# Patient Record
Sex: Male | Born: 1980 | Race: Black or African American | Hispanic: No | Marital: Single | Smoking: Current some day smoker
Health system: Southern US, Community
[De-identification: ages and names within clinical notes are randomized; demographics above are authoritative.]

## PROBLEM LIST (undated history)

## (undated) DIAGNOSIS — T7840XA Allergy, unspecified, initial encounter: Secondary | ICD-10-CM

## (undated) DIAGNOSIS — K529 Noninfective gastroenteritis and colitis, unspecified: Secondary | ICD-10-CM

## (undated) DIAGNOSIS — J45909 Unspecified asthma, uncomplicated: Secondary | ICD-10-CM

## (undated) HISTORY — DX: Allergy, unspecified, initial encounter: T78.40XA

## (undated) HISTORY — DX: Unspecified asthma, uncomplicated: J45.909

## (undated) HISTORY — PX: ANKLE SURGERY: SHX546

---

## 2014-07-16 ENCOUNTER — Ambulatory Visit (INDEPENDENT_AMBULATORY_CARE_PROVIDER_SITE_OTHER): Payer: Self-pay | Admitting: Family Medicine

## 2014-07-16 VITALS — BP 124/76 | HR 77 | Temp 98.5°F | Resp 17 | Ht 66.0 in | Wt 174.0 lb

## 2014-07-16 DIAGNOSIS — J45901 Unspecified asthma with (acute) exacerbation: Secondary | ICD-10-CM

## 2014-07-16 DIAGNOSIS — J4521 Mild intermittent asthma with (acute) exacerbation: Secondary | ICD-10-CM

## 2014-07-16 MED ORDER — ALBUTEROL SULFATE HFA 108 (90 BASE) MCG/ACT IN AERS: 2.0000 | INHALATION_SPRAY | Freq: Four times a day (QID) | RESPIRATORY_TRACT | Status: DC | PRN

## 2014-07-16 NOTE — Progress Notes (Signed)
Is a 33 year old man recently released from prison who has asthma. He really has no money and is just getting back on his feet. 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  Objective: Patient has expiratory wheezes but is in no acute respiratory difficulties. Heart: Regular no murmur  Assessment: Mild persistent asthma  Plan: Continue theQVAR and add the rescue inhaler albuterol Aref signed, Sheila OatsKurt Zaydon Kinser M.D.

## 2015-07-21 ENCOUNTER — Other Ambulatory Visit: Payer: Self-pay | Admitting: Family Medicine

## 2015-07-22 ENCOUNTER — Telehealth: Payer: Self-pay

## 2015-07-22 NOTE — Telephone Encounter (Signed)
Spoke with pt, advised Brian Esparza sent in one refill but he needs to come in. Pt understood.

## 2015-07-22 NOTE — Telephone Encounter (Signed)
Pt would like a refill on his albuterol (PROVENTIL HFA;VENTOLIN HFA) 108 (90 BASE) MCG/ACT inhaler [[id number]]. I advised him would need to be seen again before he could  get a refill. I told him I thought so. Please advise at [phone]

## 2015-07-24 ENCOUNTER — Other Ambulatory Visit: Payer: Self-pay | Admitting: Family Medicine

## 2015-09-27 ENCOUNTER — Emergency Department (HOSPITAL_COMMUNITY)
Admission: EM | Admit: 2015-09-27 | Discharge: 2015-09-27 | Disposition: A | Discharge status: Home / Self Care | Payer: No Typology Code available for payment source | Attending: Emergency Medicine | Admitting: Emergency Medicine

## 2015-09-27 ENCOUNTER — Encounter (HOSPITAL_COMMUNITY): Payer: Self-pay | Admitting: Emergency Medicine

## 2015-09-27 DIAGNOSIS — S199XXA Unspecified injury of neck, initial encounter: Secondary | ICD-10-CM | POA: Insufficient documentation

## 2015-09-27 DIAGNOSIS — Y92410 Unspecified street and highway as the place of occurrence of the external cause: Secondary | ICD-10-CM | POA: Diagnosis not present

## 2015-09-27 DIAGNOSIS — M62838 Other muscle spasm: Secondary | ICD-10-CM

## 2015-09-27 DIAGNOSIS — J45909 Unspecified asthma, uncomplicated: Secondary | ICD-10-CM | POA: Insufficient documentation

## 2015-09-27 DIAGNOSIS — Z720 Tobacco use: Secondary | ICD-10-CM | POA: Insufficient documentation

## 2015-09-27 DIAGNOSIS — S3992XA Unspecified injury of lower back, initial encounter: Secondary | ICD-10-CM | POA: Insufficient documentation

## 2015-09-27 DIAGNOSIS — M62830 Muscle spasm of back: Secondary | ICD-10-CM | POA: Diagnosis not present

## 2015-09-27 DIAGNOSIS — Y9389 Activity, other specified: Secondary | ICD-10-CM | POA: Insufficient documentation

## 2015-09-27 DIAGNOSIS — Y998 Other external cause status: Secondary | ICD-10-CM | POA: Diagnosis not present

## 2015-09-27 MED ORDER — CYCLOBENZAPRINE HCL 10 MG PO TABS: 10.0000 mg | ORAL_TABLET | Freq: Two times a day (BID) | ORAL | Status: DC | PRN

## 2015-09-27 MED ORDER — OXYCODONE-ACETAMINOPHEN 5-325 MG PO TABS: 1.0000 | ORAL_TABLET | Freq: Once | ORAL | Status: DC

## 2015-09-27 MED ORDER — ACETAMINOPHEN 500 MG PO TABS: 500.0000 mg | ORAL_TABLET | Freq: Four times a day (QID) | ORAL | Status: DC | PRN

## 2015-09-27 NOTE — Discharge Instructions (Signed)
Back Injury Prevention °Back injuries can be very painful. They can also be difficult to heal. After having one back injury, you are more likely to injure your back again. It is important to learn how to avoid injuring or re-injuring your back. The following tips can help you to prevent a back injury. °WHAT SHOULD I KNOW ABOUT PHYSICAL FITNESS? °· Exercise for 30 minutes per day on most days of the week or as directed by your health care provider. Make sure to: °· Do aerobic exercises, such as walking, jogging, biking, or swimming. °· Do exercises that increase balance and strength, such as tai chi and yoga. These can decrease your risk of falling and injuring your back. °· Do stretching exercises to help with flexibility. °· Try to develop strong abdominal muscles. Your abdominal muscles provide a lot of the support that is needed by your back. °· Maintain a healthy weight.  This helps to decrease your risk of a back injury. °WHAT SHOULD I KNOW ABOUT MY DIET? °· Talk with your health care provider about your overall diet. Take supplements and vitamins only as directed by your health care provider. °· Talk with your health care provider about how much calcium and vitamin D you need each day. These nutrients help to prevent weakening of the bones (osteoporosis). Osteoporosis can cause broken (fractured) bones, which lead to back pain. °· Include good sources of calcium in your diet, such as dairy products, green leafy vegetables, and products that have had calcium added to them (fortified). °· Include good sources of vitamin D in your diet, such as milk and foods that are fortified with vitamin D. °WHAT SHOULD I KNOW ABOUT MY POSTURE? °· Sit up straight and stand up straight. Avoid leaning forward when you sit or hunching over when you stand. °· Choose chairs that have good low-back (lumbar) support. °· If you work at a desk, sit close to it so you do not need to lean over. Keep your chin tucked in. Keep your neck  drawn back, and keep your elbows bent at a right angle. Your arms should look like the letter "L." °· Sit high and close to the steering wheel when you drive. Add a lumbar support to your car seat, if needed. °· Avoid sitting or standing in one position for very long. Take breaks to get up, stretch, and walk around at least one time every hour. Take breaks every hour if you are driving for long periods of time. °· Sleep on your side with your knees slightly bent, or sleep on your back with a pillow under your knees. Do not lie on the front of your body to sleep. °WHAT SHOULD I KNOW ABOUT LIFTING, TWISTING, AND REACHING? °Lifting and Heavy Lifting °· Avoid heavy lifting, especially repetitive heavy lifting. If you must do heavy lifting: °· Stretch before lifting. °· Work slowly. °· Rest between lifts. °· Use a tool such as a cart or a dolly to move objects if one is available. °· Make several small trips instead of carrying one heavy load. °· Ask for help when you need it, especially when moving big objects. °· Follow these steps when lifting: °· Stand with your feet shoulder-width apart. °· Get as close to the object as you can. Do not try to pick up a heavy object that is far from your body. °· Use handles or lifting straps if they are available. °· Bend at your knees. Squat down, but keep your heels off the floor. °·   Keep your shoulders pulled back, your chin tucked in, and your back straight.  Lift the object slowly while you tighten the muscles in your legs, abdomen, and buttocks. Keep the object as close to the center of your body as possible.  Follow these steps when putting down a heavy load:  Stand with your feet shoulder-width apart.  Lower the object slowly while you tighten the muscles in your legs, abdomen, and buttocks. Keep the object as close to the center of your body as possible.  Keep your shoulders pulled back, your chin tucked in, and your back straight.  Bend at your knees. Squat  down, but keep your heels off the floor.  Use handles or lifting straps if they are available. Twisting and Reaching  Avoid lifting heavy objects above your waist.  Do not twist at your waist while you are lifting or carrying a load. If you need to turn, move your feet.  Do not bend over without bending at your knees.  Avoid reaching over your head, across a table, or for an object on a high surface. WHAT ARE SOME OTHER TIPS? 1. Avoid wet floors and icy ground. Keep sidewalks clear of ice to prevent falls. 2. Do not sleep on a mattress that is too soft or too hard. 3. Keep items that are used frequently within easy reach. 4. Put heavier objects on shelves at waist level, and put lighter objects on lower or higher shelves. 5. Find ways to decrease your stress, such as exercise, massage, or relaxation techniques. Stress can build up in your muscles. Tense muscles are more vulnerable to injury. 6. Talk with your health care provider if you feel anxious or depressed. These conditions can make back pain worse. 7. Wear flat heel shoes with cushioned soles. 8. Avoid sudden movements. 9. Use both shoulder straps when carrying a backpack. 10. Do not use any tobacco products, including cigarettes, chewing tobacco, or electronic cigarettes. If you need help quitting, ask your health care provider.   This information is not intended to replace advice given to you by your health care provider. Make sure you discuss any questions you have with your health care provider.   Document Released: 01/13/2005 Document Revised: 04/22/2015 Document Reviewed: 12/10/2014 Elsevier Interactive Patient Education 2016 [employer]  Back Exercises The following exercises strengthen the muscles that help to support the back. They also help to keep the lower back flexible. Doing these exercises can help to prevent back pain or lessen existing pain. If you have back pain or discomfort, try doing these exercises 2-3  times each day or as told by your health care provider. When the pain goes away, do them once each day, but increase the number of times that you repeat the steps for each exercise (do more repetitions). If you do not have back pain or discomfort, do these exercises once each day or as told by your health care provider. EXERCISES Single Knee to Chest Repeat these steps 3-5 times for each leg:  Lie on your back on a firm bed or the floor with your legs extended.  Bring one knee to your chest. Your other leg should stay extended and in contact with the floor.  Hold your knee in place by grabbing your knee or thigh.  Pull on your knee until you feel a gentle stretch in your lower back.  Hold the stretch for 10-30 seconds.  Slowly release and straighten your leg. Pelvic Tilt Repeat these steps 5-10 times:  Lie on your back on a firm bed or the floor with your legs extended.  Bend your knees so they are pointing toward the ceiling and your feet are flat on the floor.  Tighten your lower abdominal muscles to press your lower back against the floor. This motion will tilt your pelvis so your tailbone points up toward the ceiling instead of pointing to your feet or the floor.  With gentle tension and even breathing, hold this position for 5-10 seconds. Cat-Cow Repeat these steps until your lower back becomes more flexible:  Get into a hands-and-knees position on a firm surface. Keep your hands under your shoulders, and keep your knees under your hips. You may place padding under your knees for comfort.  Let your head hang down, and point your tailbone toward the floor so your lower back becomes rounded like the back of a cat.  Hold this position for 5 seconds.  Slowly lift your head and point your tailbone up toward the ceiling so your back forms a sagging arch like the back of a cow.  Hold this position for 5 seconds. Press-Ups Repeat these steps 5-10 times:  Lie on your abdomen  (face-down) on the floor.  Place your palms near your head, about shoulder-width apart.  While you keep your back as relaxed as possible and keep your hips on the floor, slowly straighten your arms to raise the top half of your body and lift your shoulders. Do not use your back muscles to raise your upper torso. You may adjust the placement of your hands to make yourself more comfortable.  Hold this position for 5 seconds while you keep your back relaxed.  Slowly return to lying flat on the floor. Bridges Repeat these steps 10 times:  Lie on your back on a firm surface.  Bend your knees so they are pointing toward the ceiling and your feet are flat on the floor.  Tighten your buttocks muscles and lift your buttocks off of the floor until your waist is at almost the same height as your knees. You should feel the muscles working in your buttocks and the back of your thighs. If you do not feel these muscles, slide your feet 1-2 inches farther away from your buttocks.  Hold this position for 3-5 seconds.  Slowly lower your hips to the starting position, and allow your buttocks muscles to relax completely. If this exercise is too easy, try doing it with your arms crossed over your chest. Abdominal Crunches Repeat these steps 5-10 times: 11. Lie on your back on a firm bed or the floor with your legs extended. 12. Bend your knees so they are pointing toward the ceiling and your feet are flat on the floor. 44. Cross your arms over your chest. 14. Tip your chin slightly toward your chest without bending your neck. 68. Tighten your abdominal muscles and slowly raise your trunk (torso) high enough to lift your shoulder blades a tiny bit off of the floor. Avoid raising your torso higher than that, because it can put too much stress on your low back and it does not help to strengthen your abdominal muscles. 16. Slowly return to your starting position. Back Lifts Repeat these steps 5-10  times: 1. Lie on your abdomen (face-down) with your arms at your sides, and rest your forehead on the floor. 2. Tighten the muscles in your legs and your buttocks. 3. Slowly lift your chest off of the floor while you keep your hips  pressed to the floor. Keep the back of your head in line with the curve in your back. Your eyes should be looking at the floor. 4. Hold this position for 3-5 seconds. 5. Slowly return to your starting position. SEEK MEDICAL CARE IF:  Your back pain or discomfort gets much worse when you do an exercise.  Your back pain or discomfort does not lessen within 2 hours after you exercise. If you have any of these problems, stop doing these exercises right away. Do not do them again unless your health care provider says that you can. SEEK IMMEDIATE MEDICAL CARE IF:  You develop sudden, severe back pain. If this happens, stop doing the exercises right away. Do not do them again unless your health care provider says that you can.   This information is not intended to replace advice given to you by your health care provider. Make sure you discuss any questions you have with your health care provider.   Document Released: 01/13/2005 Document Revised: 08/27/2015 Document Reviewed: 01/30/2015 Elsevier Interactive Patient Education 2016 [place] therapy can help ease sore, stiff, injured, and tight muscles and joints. Heat relaxes your muscles, which may help ease your pain.  RISKS AND COMPLICATIONS If you have any of the following conditions, do not use heat therapy unless your health care provider has approved:  Poor circulation.  Healing wounds or scarred skin in the area being treated.  Diabetes, heart disease, or high blood pressure.  Not being able to feel (numbness) the area being treated.  Unusual swelling of the area being treated.  Active infections.  Blood clots.  Cancer.  Inability to communicate pain. This may include young  children and people who have problems with their brain function (dementia).  Pregnancy. Heat therapy should only be used on old, pre-existing, or long-lasting (chronic) injuries. Do not use heat therapy on new injuries unless directed by your health care provider. HOW TO USE HEAT THERAPY There are several different kinds of heat therapy, including:  Moist heat pack.  Warm water bath.  Hot water bottle.  Electric heating pad.  Heated gel pack.  Heated wrap.  Electric heating pad. Use the heat therapy method suggested by your health care provider. Follow your health care provider's instructions on when and how to use heat therapy. GENERAL HEAT THERAPY RECOMMENDATIONS  Do not sleep while using heat therapy. Only use heat therapy while you are awake.  Your skin may turn pink while using heat therapy. Do not use heat therapy if your skin turns red.  Do not use heat therapy if you have new pain.  High heat or long exposure to heat can cause burns. Be careful when using heat therapy to avoid burning your skin.  Do not use heat therapy on areas of your skin that are already irritated, such as with a rash or sunburn. SEEK MEDICAL CARE IF:  You have blisters, redness, swelling, or numbness.  You have new pain.  Your pain is worse. MAKE SURE YOU:  Understand these instructions.  Will watch your condition.  Will get help right away if you are not doing well or get worse.   This information is not intended to replace advice given to you by your health care provider. Make sure you discuss any questions you have with your health care provider.   Return to the emergency department a few experience bowel or bladder incontinence, numbness or tingling the extremities, worsening of her symptoms, difficulty breathing,  chest pain.
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
## 2015-09-29 ENCOUNTER — Emergency Department (HOSPITAL_COMMUNITY): Payer: No Typology Code available for payment source

## 2015-09-29 ENCOUNTER — Emergency Department (HOSPITAL_COMMUNITY)
Admission: EM | Admit: 2015-09-29 | Discharge: 2015-09-29 | Disposition: A | Discharge status: Home / Self Care | Payer: No Typology Code available for payment source | Attending: Emergency Medicine | Admitting: Emergency Medicine

## 2015-09-29 ENCOUNTER — Encounter (HOSPITAL_COMMUNITY): Payer: Self-pay | Admitting: *Deleted

## 2015-09-29 DIAGNOSIS — Y998 Other external cause status: Secondary | ICD-10-CM | POA: Insufficient documentation

## 2015-09-29 DIAGNOSIS — Y92410 Unspecified street and highway as the place of occurrence of the external cause: Secondary | ICD-10-CM | POA: Diagnosis not present

## 2015-09-29 DIAGNOSIS — M545 Low back pain, unspecified: Secondary | ICD-10-CM

## 2015-09-29 DIAGNOSIS — Z720 Tobacco use: Secondary | ICD-10-CM | POA: Diagnosis not present

## 2015-09-29 DIAGNOSIS — S3992XA Unspecified injury of lower back, initial encounter: Secondary | ICD-10-CM | POA: Insufficient documentation

## 2015-09-29 DIAGNOSIS — S134XXA Sprain of ligaments of cervical spine, initial encounter: Secondary | ICD-10-CM | POA: Diagnosis not present

## 2015-09-29 DIAGNOSIS — S199XXA Unspecified injury of neck, initial encounter: Secondary | ICD-10-CM | POA: Diagnosis present

## 2015-09-29 DIAGNOSIS — Z79899 Other long term (current) drug therapy: Secondary | ICD-10-CM | POA: Diagnosis not present

## 2015-09-29 DIAGNOSIS — S139XXA Sprain of joints and ligaments of unspecified parts of neck, initial encounter: Secondary | ICD-10-CM

## 2015-09-29 DIAGNOSIS — J45909 Unspecified asthma, uncomplicated: Secondary | ICD-10-CM | POA: Diagnosis not present

## 2015-09-29 DIAGNOSIS — Y9389 Activity, other specified: Secondary | ICD-10-CM | POA: Diagnosis not present

## 2015-09-29 MED ORDER — NAPROXEN 500 MG PO TABS: 500.0000 mg | ORAL_TABLET | Freq: Two times a day (BID) | ORAL | Status: DC

## 2015-09-29 NOTE — ED Notes (Signed)
Declined W/C at D/C and was escorted to lobby by RN. 
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
## 2015-12-14 ENCOUNTER — Encounter (HOSPITAL_COMMUNITY): Payer: Self-pay | Admitting: Vascular Surgery

## 2015-12-14 ENCOUNTER — Emergency Department (HOSPITAL_COMMUNITY)
Admission: EM | Admit: 2015-12-14 | Discharge: 2015-12-14 | Disposition: A | Discharge status: Home / Self Care | Payer: Self-pay | Attending: Emergency Medicine | Admitting: Emergency Medicine

## 2015-12-14 ENCOUNTER — Emergency Department (HOSPITAL_COMMUNITY): Payer: Self-pay

## 2015-12-14 DIAGNOSIS — Z79899 Other long term (current) drug therapy: Secondary | ICD-10-CM | POA: Insufficient documentation

## 2015-12-14 DIAGNOSIS — J45901 Unspecified asthma with (acute) exacerbation: Secondary | ICD-10-CM | POA: Insufficient documentation

## 2015-12-14 DIAGNOSIS — Z791 Long term (current) use of non-steroidal anti-inflammatories (NSAID): Secondary | ICD-10-CM | POA: Insufficient documentation

## 2015-12-14 DIAGNOSIS — F17210 Nicotine dependence, cigarettes, uncomplicated: Secondary | ICD-10-CM | POA: Insufficient documentation

## 2015-12-14 MED ORDER — ALBUTEROL SULFATE (2.5 MG/3ML) 0.083% IN NEBU
INHALATION_SOLUTION | RESPIRATORY_TRACT | Status: AC
  Filled 2015-12-14: qty 6

## 2015-12-14 MED ORDER — PREDNISONE 20 MG PO TABS: 40.0000 mg | ORAL_TABLET | Freq: Every day | ORAL | Status: DC

## 2015-12-14 MED ORDER — ALBUTEROL SULFATE (2.5 MG/3ML) 0.083% IN NEBU
5.0000 mg | INHALATION_SOLUTION | Freq: Once | RESPIRATORY_TRACT | Status: AC
  Administered 2015-12-14: 5 mg via RESPIRATORY_TRACT

## 2015-12-14 MED ORDER — ALBUTEROL SULFATE HFA 108 (90 BASE) MCG/ACT IN AERS
2.0000 | INHALATION_SPRAY | Freq: Once | RESPIRATORY_TRACT | Status: AC
  Administered 2015-12-14: 2 via RESPIRATORY_TRACT
  Filled 2015-12-14: qty 6.7

## 2015-12-14 MED ORDER — PREDNISONE 20 MG PO TABS
60.0000 mg | ORAL_TABLET | Freq: Once | ORAL | Status: AC
  Administered 2015-12-14: 60 mg via ORAL
  Filled 2015-12-14: qty 3

## 2015-12-14 NOTE — ED Notes (Signed)
Patient transported to X-ray 

## 2015-12-14 NOTE — ED Notes (Signed)
Pt reports to the ED for eval of asthma exacerbation. Pt reports that this past Wednesday he ran out of his inhaler and has been having SOB and wheezing ever since. Pt also reports nasal congestion and a productive green cough. Denies any fevers, chills, or N/V/D. Pt A&Ox4, resp e/u, and skin warm and dry.

## 2015-12-14 NOTE — Discharge Instructions (Signed)
Inhaler 2 puffs every 4 hrs. Prednisone as prescribed until all gone. Follow up with primary care doctor. Stop smoking.    Asthma, Adult Asthma is a recurring condition in which the airways tighten and narrow. Asthma can make it difficult to breathe. It can cause coughing, wheezing, and shortness of breath. Asthma episodes, also called asthma attacks, range from minor to life-threatening. Asthma cannot be cured, but medicines and lifestyle changes can help control it. CAUSES Asthma is believed to be caused by inherited (genetic) and environmental factors, but its exact cause is unknown. Asthma may be triggered by allergens, lung infections, or irritants in the air. Asthma triggers are different for each person. Common triggers include:   Animal dander.  Dust mites.  Cockroaches.  Pollen from trees or grass.  Mold.  Smoke.  Air pollutants such as dust, household cleaners, hair sprays, aerosol sprays, paint fumes, strong chemicals, or strong odors.  Cold air, weather changes, and winds (which increase molds and pollens in the air).  Strong emotional expressions such as crying or laughing hard.  Stress.  Certain medicines (such as aspirin) or types of drugs (such as beta-blockers).  Sulfites in foods and drinks. Foods and drinks that may contain sulfites include dried fruit, potato chips, and sparkling grape juice.  Infections or inflammatory conditions such as the flu, a cold, or an inflammation of the nasal membranes (rhinitis).  Gastroesophageal reflux disease (GERD).  Exercise or strenuous activity. SYMPTOMS Symptoms may occur immediately after asthma is triggered or many hours later. Symptoms include:  Wheezing.  Excessive nighttime or early morning coughing.  Frequent or severe coughing with a common cold.  Chest tightness.  Shortness of breath. DIAGNOSIS  The diagnosis of asthma is made by a review of your medical history and a physical exam. Tests may also be  performed. These may include:  Lung function studies. These tests show how much air you breathe in and out.  Allergy tests.  Imaging tests such as X-rays. TREATMENT  Asthma cannot be cured, but it can usually be controlled. Treatment involves identifying and avoiding your asthma triggers. It also involves medicines. There are 2 classes of medicine used for asthma treatment:   Controller medicines. These prevent asthma symptoms from occurring. They are usually taken every day.  Reliever or rescue medicines. These quickly relieve asthma symptoms. They are used as needed and provide short-term relief. Your health care provider will help you create an asthma action plan. An asthma action plan is a written plan for managing and treating your asthma attacks. It includes a list of your asthma triggers and how they may be avoided. It also includes information on when medicines should be taken and when their dosage should be changed. An action plan may also involve the use of a device called a peak flow meter. A peak flow meter measures how well the lungs are working. It helps you monitor your condition. HOME CARE INSTRUCTIONS   Take medicines only as directed by your health care provider. Speak with your health care provider if you have questions about how or when to take the medicines.  Use a peak flow meter as directed by your health care provider. Record and keep track of readings.  Understand and use the action plan to help minimize or stop an asthma attack without needing to seek medical care.  Control your home environment in the following ways to help prevent asthma attacks:  Do not smoke. Avoid being exposed to secondhand smoke.  Change your  heating and air conditioning filter regularly.  Limit your use of fireplaces and wood stoves.  Get rid of pests (such as roaches and mice) and their droppings.  Throw away plants if you see mold on them.  Clean your floors and dust regularly.  Use unscented cleaning products.  Try to have someone else vacuum for you regularly. Stay out of rooms while they are being vacuumed and for a short while afterward. If you vacuum, use a dust mask from a hardware store, a double-layered or microfilter vacuum cleaner bag, or a vacuum cleaner with a HEPA filter.  Replace carpet with wood, tile, or vinyl flooring. Carpet can trap dander and dust.  Use allergy-proof pillows, mattress covers, and box spring covers.  Wash bed sheets and blankets every week in hot water and dry them in a dryer.  Use blankets that are made of polyester or cotton.  Clean bathrooms and kitchens with bleach. If possible, have someone repaint the walls in these rooms with mold-resistant paint. Keep out of the rooms that are being cleaned and painted.  Wash hands frequently. SEEK MEDICAL CARE IF:   You have wheezing, shortness of breath, or a cough even if taking medicine to prevent attacks.  The colored mucus you cough up (sputum) is thicker than usual.  Your sputum changes from clear or white to yellow, green, gray, or bloody.  You have any problems that may be related to the medicines you are taking (such as a rash, itching, swelling, or trouble breathing).  You are using a reliever medicine more than 2-3 times per week.  Your peak flow is still at 50-79% of your personal best after following your action plan for 1 hour.  You have a fever. SEEK IMMEDIATE MEDICAL CARE IF:   You seem to be getting worse and are unresponsive to treatment during an asthma attack.  You are short of breath even at rest.  You get short of breath when doing very little physical activity.  You have difficulty eating, drinking, or talking due to asthma symptoms.  You develop chest pain.  You develop a fast heartbeat.  You have a bluish color to your lips or fingernails.  You are light-headed, dizzy, or faint.  Your peak flow is less than 50% of your personal best.     This information is not intended to replace advice given to you by your health care provider. Make sure you discuss any questions you have with your health care provider.   Document Released: 12/06/2005 Document Revised: 08/27/2015 Document Reviewed: 07/05/2013 Elsevier Interactive Patient Education [employer]

## 2015-12-14 NOTE — ED Provider Notes (Signed)
CSN: [id number]     Arrival date & time 12/14/15  4098 History  By signing my name below, I, Budd Palmer, attest that this documentation has been prepared under the direction and in the presence of [employer], PA-C. Electronically Signed: Budd Palmer, ED Scribe. 12/14/2015. 8:45 PM.    Chief Complaint  Patient presents with  . Asthma   The history is provided by the patient. No language interpreter was used.   HPI Comments: Brian Esparza is a 34 y.o. male occasional smoker with a PMHx of asthma and allergy who presents to the Emergency Department complaining of an asthma exacerbation onset 3 days ago. He reports associated SOB and wheezing. He notes he is also having cold-like symptoms, including productive cough and congestion for over a week. He notes he has not tried any medication for this. He notes he went to see his PCP in August, who told him he would write a prescription for an inhaler, but when he went to the pharmacy last week, they told him they had not received the prescription. Pt denies fever.   Past Medical History  Diagnosis Date  . Allergy   . Asthma    Past Surgical History  Procedure Laterality Date  . Ankle surgery     No family history on file. Social History  Substance Use Topics  . Smoking status: Current Every Day Smoker -- 0.00 packs/day for 0 years    Types: Cigarettes  . Smokeless tobacco: None  . Alcohol Use: Yes     Comment: occasionally    Review of Systems  Constitutional: Negative for fever.  HENT: Positive for congestion.   Respiratory: Positive for cough, shortness of breath and wheezing.     Allergies  Asa  Home Medications   Prior to Admission medications   Medication Sig Start Date End Date Taking? Authorizing Provider  acetaminophen (TYLENOL) 500 MG tablet Take 1 tablet (500 mg total) by mouth every 6 (six) hours as needed. 09/27/15   Samantha Tripp Dowless, PA-C  albuterol (PROAIR HFA) 108 (90 BASE) MCG/ACT inhaler  Inhale 2 puffs into the lungs every 6 (six) hours as needed. PATIENT NEEDS OFFICE VISIT FOR ADDITIONAL REFILLS 07/22/15   Wallis Bamberg, PA-C  cyclobenzaprine (FLEXERIL) 10 MG tablet Take 1 tablet (10 mg total) by mouth 2 (two) times daily as needed for muscle spasms. 09/27/15   Samantha Tripp Dowless, PA-C  naproxen (NAPROSYN) 500 MG tablet Take 1 tablet (500 mg total) by mouth 2 (two) times daily. 09/29/15   Heather Laisure, PA-C   BP 110/67 mmHg  Pulse 88  Temp(Src) 97.9 F (36.6 C) (Oral)  Resp 14  SpO2 96% Physical Exam  Constitutional: He is oriented to person, place, and time. He appears well-developed and well-nourished.  HENT:  Head: Normocephalic and atraumatic.  Right Ear: External ear normal.  Left Ear: External ear normal.  Nose: Nose normal.  Mouth/Throat: Oropharynx is clear and moist.  Eyes: Conjunctivae are normal. Right eye exhibits no discharge. Left eye exhibits no discharge.  Neck: Neck supple.  Cardiovascular: Normal rate and regular rhythm.   Pulmonary/Chest: Effort normal. No respiratory distress. He has wheezes. He has rales.  End expiratory wheezes at bases bilaterally. Rales at bases bilaterally  Neurological: He is alert and oriented to person, place, and time. Coordination normal.  Skin: Skin is warm and dry. No rash noted. He is not diaphoretic. No erythema.  Psychiatric: He has a normal mood and affect.  Nursing note and vitals reviewed.  ED Course  Procedures  DIAGNOSTIC STUDIES: Oxygen Saturation is 96% on RA, adequate by my interpretation.    COORDINATION OF CARE: 8:39 PM - Discussed plans to order a chest XR, prednisone, and an inhaler. Pt advised of plan for treatment and pt agrees.  Labs Review Labs Reviewed - No data to display  Imaging Review Dg Chest 2 View  12/14/2015  CLINICAL DATA:  Cough and congestion.  Fever EXAM: CHEST  2 VIEW COMPARISON:  None. FINDINGS: The heart size and mediastinal contours are within normal limits. Both lungs  are clear. The visualized skeletal structures are unremarkable. IMPRESSION: No active cardiopulmonary disease. Electronically Signed   By: Marlan Palauharles  Clark M.D.   On: 12/14/2015 21:02   I have personally reviewed and evaluated these images as part of my medical decision-making.   EKG Interpretation None      MDM   Final diagnoses:  Asthma exacerbation    patient with asthma exacerbation. Received breathing treatment while in the waiting room. On my exam, mild rales and wheezing.  Vital signs are normal. Chest x-ray is negative. Plan to discharge home with prednisone, inhaler, close follow-up with primary care doctor.   Filed Vitals:   12/14/15 1922 12/14/15 2116  BP: 110/67 103/60  Pulse: 88 61  Temp: 97.9 F (36.6 C) 98.1 F (36.7 C)  TempSrc: Oral Oral  Resp: 14 20  SpO2: 96% 99%    I personally performed the services described in this documentation, which was scribed in my presence. The recorded information has been reviewed and is accurate.   Jaynie Crumbleatyana Hawkin Charo, PA-C 12/16/15 0147  Bethann BerkshireJoseph Zammit, MD 12/18/15 1028

## 2015-12-29 ENCOUNTER — Telehealth: Payer: Self-pay

## 2015-12-29 NOTE — Telephone Encounter (Signed)
Patient needs a refill for albuterol. Patient states that we never sent his medication when he was last here on July 2015. Patient had to go to the hospital this past week for asthma issues and states that he should be reimbursed for the bill because it's our fault that he was never sent anything. I asked patient did he call and let us know right after his office visit and he said yes. Please call patient! [phone]

## 2015-12-30 ENCOUNTER — Other Ambulatory Visit: Payer: Self-pay | Admitting: Family Medicine

## 2015-12-30 DIAGNOSIS — J683 Other acute and subacute respiratory conditions due to chemicals, gases, fumes and vapors: Secondary | ICD-10-CM

## 2015-12-30 MED ORDER — ALBUTEROL SULFATE HFA 108 (90 BASE) MCG/ACT IN AERS: 2.0000 | INHALATION_SPRAY | Freq: Four times a day (QID) | RESPIRATORY_TRACT | Status: DC | PRN

## 2015-12-30 NOTE — Telephone Encounter (Signed)
Dr L pt states you know who he is and does not have the money to come in. I explained to him all of our policies and procedures for refilling medications and went through the dates of refills etc. Please advise on refill for inhaler.

## 2015-12-31 NOTE — Progress Notes (Signed)
Pt advised.

## 2016-04-06 ENCOUNTER — Ambulatory Visit
Admission: RE | Admit: 2016-04-06 | Discharge: 2016-04-06 | Disposition: A | Discharge status: Home / Self Care | Payer: Worker's Compensation | Source: Ambulatory Visit | Attending: Family Medicine | Admitting: Family Medicine

## 2016-04-06 ENCOUNTER — Other Ambulatory Visit: Payer: Self-pay | Admitting: Family Medicine

## 2016-04-06 DIAGNOSIS — M542 Cervicalgia: Secondary | ICD-10-CM

## 2016-08-24 ENCOUNTER — Ambulatory Visit (INDEPENDENT_AMBULATORY_CARE_PROVIDER_SITE_OTHER): Payer: BLUE CROSS/BLUE SHIELD | Admitting: Physician Assistant

## 2016-08-24 ENCOUNTER — Ambulatory Visit (INDEPENDENT_AMBULATORY_CARE_PROVIDER_SITE_OTHER): Payer: BLUE CROSS/BLUE SHIELD

## 2016-08-24 VITALS — BP 100/72 | HR 62 | Temp 98.3°F | Resp 18 | Ht 67.0 in | Wt 154.0 lb

## 2016-08-24 DIAGNOSIS — R059 Cough, unspecified: Secondary | ICD-10-CM

## 2016-08-24 DIAGNOSIS — R05 Cough: Secondary | ICD-10-CM | POA: Diagnosis not present

## 2016-08-24 DIAGNOSIS — R195 Other fecal abnormalities: Secondary | ICD-10-CM

## 2016-08-24 DIAGNOSIS — J4521 Mild intermittent asthma with (acute) exacerbation: Secondary | ICD-10-CM | POA: Diagnosis not present

## 2016-08-24 DIAGNOSIS — J683 Other acute and subacute respiratory conditions due to chemicals, gases, fumes and vapors: Secondary | ICD-10-CM

## 2016-08-24 LAB — POCT CBC
GRANULOCYTE PERCENT: 52.8 % (ref 37–80)
HEMATOCRIT: 45 % (ref 43.5–53.7)
HEMOGLOBIN: 15.4 g/dL (ref 14.1–18.1)
LYMPH, POC: 2.1 (ref 0.6–3.4)
MCH, POC: 30.7 pg (ref 27–31.2)
MCHC: 34.3 g/dL (ref 31.8–35.4)
MCV: 89.6 fL (ref 80–97)
MID (cbc): 0.5 (ref 0–0.9)
MPV: 7.8 fL (ref 0–99.8)
PLATELET COUNT, POC: 230 10*3/uL (ref 142–424)
POC GRANULOCYTE: 2.9 (ref 2–6.9)
POC LYMPH PERCENT: 37.4 %L (ref 10–50)
POC MID %: 9.8 %M (ref 0–12)
RBC: 5.02 M/uL (ref 4.69–6.13)
RDW, POC: 13.3 %
WBC: 5.5 10*3/uL (ref 4.6–10.2)

## 2016-08-24 MED ORDER — HYDROCODONE-HOMATROPINE 5-1.5 MG/5ML PO SYRP: ORAL_SOLUTION | ORAL | 0 refills | Status: DC

## 2016-08-24 MED ORDER — BENZONATATE 100 MG PO CAPS: 100.0000 mg | ORAL_CAPSULE | Freq: Three times a day (TID) | ORAL | 0 refills | Status: DC | PRN

## 2016-08-24 NOTE — Patient Instructions (Addendum)
-   We will treat this as a respiratory viral infection.  -I recommend buying OTC mucinex and take daily  - I recommend you rest, drink plenty of fluids, eat light meals including soups.  - You may use cough syrup at night for your cough and sore throat, Tessalon pearls during the day. Be aware that cough syrup can definitely make you drowsy and sleepy so do not drive or operate any heavy machinery if it is affecting you during the day.  - You may also use Tylenol or ibuprofen over-the-counter for your sore throat.  - Please let me know if you are not seeing any improvement or get worse in one week.   For stools, take OTC probiotic for colon health and daily multivitamin      IF you received an x-ray today, you will receive an invoice from [facility]. Please contact [facility] at [phone] with questions or concerns regarding your invoice.   IF you received labwork today, you will receive an invoice from [facility]. Please contact [facility] at [phone] with questions or concerns regarding your invoice.   Our billing staff will not be able to assist you with questions regarding bills from these companies.  You will be contacted with the lab results as soon as they are available. The fastest way to get your results is to activate your My Chart account. Instructions are located on the last page of this paperwork. If you have not heard from us regarding the results in 2 weeks, please contact this office.

## 2016-08-24 NOTE — Progress Notes (Signed)
Brian Esparza  MRN: [id number] DOB: 1981/12/13  Subjective:  Brian Esparza is a 35 y.o. male seen in office today for a chief complaint of productive cough x 3 weeks. States the cough has worsened over the three weeks but has finally stabilized over the past three days. He has no associated symptoms. His mom was recently diagnosed with bronchitis and he has been around her.He denies recent travel. Pt does have a history of intermittent asthma. States he has had to use his albuterol inhaler a lot more frequently over the past three weeks. States he has used it every other day for cough. Prior to this, he only uses his inhaler once every few months.   Pt also notes he has intermittent green stools over the past few weeks. States that the consistency and frequency of stools has not changed for him just the color. When asked about his diet, he does admit to eliminating meats from his diet recently. He has increased his vegetable consumption and is eating more leafy greens and natural juices. He denies mucus or blood in stools, constipation, and abdominal pain.  Review of Systems  Constitutional: Negative for chills, fatigue and fever.  HENT: Positive for congestion and sore throat.   Respiratory: Positive for shortness of breath and wheezing.   Gastrointestinal: Positive for diarrhea ( x 2-3 episodes over the past few weeks). Negative for abdominal pain, nausea and vomiting.  Neurological: Negative for dizziness, light-headedness and headaches.   There are no active problems to display for this patient.   Current Outpatient Prescriptions on File Prior to Visit  Medication Sig Dispense Refill  . albuterol (PROAIR HFA) 108 (90 Base) MCG/ACT inhaler Inhale 2 puffs into the lungs every 6 (six) hours as needed. 8.5 Inhaler 5  . naproxen (NAPROSYN) 500 MG tablet Take 1 tablet (500 mg total) by mouth 2 (two) times daily. 30 tablet 0   No current facility-administered medications on file prior to  visit.     Allergies  Allergen Reactions  . Asa [Aspirin] Rash   Social History   Social History  . Marital status: Single    Spouse name: N/A  . Number of children: N/A  . Years of education: N/A   Occupational History  . Not on file.   Social History Main Topics  . Smoking status: Current Some Day Smoker    Packs/day: 0.00    Years: 0.00    Types: Cigarettes  . Smokeless tobacco: Never Used  . Alcohol use Yes     Comment: occasionally  . Drug use: No  . Sexual activity: No   Other Topics Concern  . Not on file   Social History Narrative  . No narrative on file    Objective:  BP 100/72 (BP Location: Right Arm, Patient Position: Sitting, Cuff Size: Normal)   Pulse 62   Temp 98.3 F (36.8 C)   Resp 18   Ht 5\' 7"  (1.702 m)   Wt 154 lb (69.9 kg)   SpO2 98%   BMI 24.12 kg/m   Physical Exam  Constitutional: He is oriented to person, place, and time and well-developed, well-nourished, and in no distress.  HENT:  Head: Normocephalic and atraumatic.  Right Ear: Tympanic membrane, external ear and ear canal normal.  Left Ear: Tympanic membrane, external ear and ear canal normal.  Nose: Mucosal edema present.  Mouth/Throat: Posterior oropharyngeal erythema present.  Eyes: Conjunctivae are normal.  Neck: Normal range of motion.  Cardiovascular: Normal rate, regular rhythm  and normal heart sounds.   Pulmonary/Chest: Effort normal. He has no wheezes. He has rhonchi (in posterior lung field, cleared with cough). He has no rales.  Abdominal: Soft. Normal appearance and bowel sounds are normal. There is no tenderness.  Lymphadenopathy:       Head (right side): No submental, no submandibular, no tonsillar, no preauricular, no posterior auricular and no occipital adenopathy present.       Head (left side): No submental, no submandibular, no tonsillar, no preauricular, no posterior auricular and no occipital adenopathy present.    He has no cervical adenopathy.        Right: No supraclavicular adenopathy present.       Left: No supraclavicular adenopathy present.  Neurological: He is alert and oriented to person, place, and time. Gait normal.  Skin: Skin is warm and dry.  Psychiatric: Affect normal.  Vitals reviewed.   Results for orders placed or performed in visit on 08/24/16 (from the past 24 hour(s))  POCT CBC     Status: None   Collection Time: 08/24/16  5:39 PM  Result Value Ref Range   WBC 5.5 4.6 - 10.2 K/uL   Lymph, poc 2.1 0.6 - 3.4   POC LYMPH PERCENT 37.4 10 - 50 %L   MID (cbc) 0.5 0 - 0.9   POC MID % 9.8 0 - 12 %M   POC Granulocyte 2.9 2 - 6.9   Granulocyte percent 52.8 37 - 80 %G   RBC 5.02 4.69 - 6.13 M/uL   Hemoglobin 15.4 14.1 - 18.1 g/dL   HCT, POC 16.1 09.6 - 53.7 %   MCV 89.6 80 - 97 fL   MCH, POC 30.7 27 - 31.2 pg   MCHC 34.3 31.8 - 35.4 g/dL   RDW, POC 04.5 %   Platelet Count, POC 230 142 - 424 K/uL   MPV 7.8 0 - 99.8 fL    Dg Chest 2 View  Result Date: 08/24/2016 CLINICAL DATA:  Cough. EXAM: CHEST  2 VIEW COMPARISON:  12/14/2015. FINDINGS: Mediastinum and hilar structures normal. Lungs are clear. Heart size normal. No pleural effusion or pneumothorax. No acute bony abnormality identified. IMPRESSION: No acute cardiopulmonary disease. Chest is stable from prior study of 12/14/2015. Electronically Signed   By: Maisie Fus  Register   On: 08/24/2016 17:30    Assessment and Plan :   1. Cough -Likely viral in nature, will treat supportively  -Encouraged to stop smoking especially while cough is present  - DG Chest 2 View; Future - POCT CBC - benzonatate (TESSALON) 100 MG capsule; Take 1-2 capsules (100-200 mg total) by mouth 3 (three) times daily as needed for cough.  Dispense: 40 capsule; Refill: 0 - HYDROcodone-homatropine (HYCODAN) 5-1.5 MG/5ML syrup; Take at night as needed for cough.  Dispense: 120 mL; Refill: 0 -OTC mucinex -If no improvement in one week, contact me.   2. Change in stool -Encouraged to  document what he has eaten on the days he notices the green stool - POC Hemoccult Bld/Stl (3-Cd Home Screen); Future -OTC probiotic -Return to clinic if symptoms worsen, do not improve, or as needed    Benjiman Core PA-C  [facility] 08/24/2016 5:41 PM

## 2016-08-25 ENCOUNTER — Encounter: Payer: Self-pay | Admitting: Physician Assistant

## 2016-08-31 ENCOUNTER — Telehealth: Payer: Self-pay

## 2016-08-31 NOTE — Telephone Encounter (Signed)
Brian Esparza    Patient has more questions regarding his last OV.  Please call [phone]

## 2016-09-01 ENCOUNTER — Emergency Department (HOSPITAL_COMMUNITY)
Admission: EM | Admit: 2016-09-01 | Discharge: 2016-09-01 | Disposition: A | Discharge status: Home / Self Care | Payer: BLUE CROSS/BLUE SHIELD | Attending: Emergency Medicine | Admitting: Emergency Medicine

## 2016-09-01 ENCOUNTER — Emergency Department (HOSPITAL_COMMUNITY): Payer: BLUE CROSS/BLUE SHIELD

## 2016-09-01 ENCOUNTER — Encounter (HOSPITAL_COMMUNITY): Payer: Self-pay | Admitting: Emergency Medicine

## 2016-09-01 DIAGNOSIS — J45909 Unspecified asthma, uncomplicated: Secondary | ICD-10-CM | POA: Insufficient documentation

## 2016-09-01 DIAGNOSIS — R1031 Right lower quadrant pain: Secondary | ICD-10-CM

## 2016-09-01 DIAGNOSIS — F17210 Nicotine dependence, cigarettes, uncomplicated: Secondary | ICD-10-CM | POA: Insufficient documentation

## 2016-09-01 LAB — COMPREHENSIVE METABOLIC PANEL
ALK PHOS: 71 U/L (ref 38–126)
ALT: 11 U/L — ABNORMAL LOW (ref 17–63)
AST: 22 U/L (ref 15–41)
Albumin: 4.1 g/dL (ref 3.5–5.0)
Anion gap: 7 (ref 5–15)
BILIRUBIN TOTAL: 1.2 mg/dL (ref 0.3–1.2)
BUN: 8 mg/dL (ref 6–20)
CALCIUM: 9.3 mg/dL (ref 8.9–10.3)
CO2: 28 mmol/L (ref 22–32)
CREATININE: 1.03 mg/dL (ref 0.61–1.24)
Chloride: 105 mmol/L (ref 101–111)
GFR calc Af Amer: >60 mL/min (ref >60)
Glucose, Bld: 84 mg/dL (ref 65–99)
POTASSIUM: 4.1 mmol/L (ref 3.5–5.1)
Sodium: 140 mmol/L (ref 135–145)
TOTAL PROTEIN: 7.4 g/dL (ref 6.5–8.1)

## 2016-09-01 LAB — CBC WITH DIFFERENTIAL/PLATELET
BASOS ABS: 0 10*3/uL (ref 0.0–0.1)
BASOS PCT: 1 %
EOS ABS: 0.2 10*3/uL (ref 0.0–0.7)
EOS PCT: 6 %
HCT: 46.7 % (ref 39.0–52.0)
Hemoglobin: 15.9 g/dL (ref 13.0–17.0)
Lymphocytes Relative: 37 %
Lymphs Abs: 1.5 10*3/uL (ref 0.7–4.0)
MCH: 30.5 pg (ref 26.0–34.0)
MCHC: 34 g/dL (ref 30.0–36.0)
MCV: 89.6 fL (ref 78.0–100.0)
MONOS PCT: 8 %
Monocytes Absolute: 0.3 10*3/uL (ref 0.1–1.0)
NEUTROS ABS: 2 10*3/uL (ref 1.7–7.7)
Neutrophils Relative %: 48 %
PLATELETS: 223 10*3/uL (ref 150–400)
RBC: 5.21 MIL/uL (ref 4.22–5.81)
RDW: 12.7 % (ref 11.5–15.5)
WBC: 4.1 10*3/uL (ref 4.0–10.5)

## 2016-09-01 LAB — URINALYSIS, ROUTINE W REFLEX MICROSCOPIC
BILIRUBIN URINE: NEGATIVE
Glucose, UA: NEGATIVE mg/dL
Hgb urine dipstick: NEGATIVE
KETONES UR: NEGATIVE mg/dL
LEUKOCYTES UA: NEGATIVE
NITRITE: NEGATIVE
PROTEIN: NEGATIVE mg/dL
Specific Gravity, Urine: 1.022 (ref 1.005–1.030)
pH: 6 (ref 5.0–8.0)

## 2016-09-01 LAB — LIPASE, BLOOD: LIPASE: 34 U/L (ref 11–51)

## 2016-09-01 MED ORDER — MORPHINE SULFATE (PF) 4 MG/ML IV SOLN
4.0000 mg | Freq: Once | INTRAVENOUS | Status: AC
  Administered 2016-09-01: 4 mg via INTRAVENOUS
  Filled 2016-09-01: qty 1

## 2016-09-01 MED ORDER — SODIUM CHLORIDE 0.9 % IV BOLUS (SEPSIS)
1000.0000 mL | Freq: Once | INTRAVENOUS | Status: AC
  Administered 2016-09-01: 1000 mL via INTRAVENOUS

## 2016-09-01 MED ORDER — SODIUM CHLORIDE 0.9 % IV SOLN: INTRAVENOUS | Status: DC

## 2016-09-01 MED ORDER — IOPAMIDOL (ISOVUE-300) INJECTION 61%
INTRAVENOUS | Status: AC
  Administered 2016-09-01: 100 mL
  Filled 2016-09-01: qty 100

## 2016-09-01 NOTE — ED Notes (Signed)
Kayla, PA at bedside. 

## 2016-09-01 NOTE — ED Notes (Signed)
Patient is aware of need of urine specimen; urinal is at bedside; visitor at bedside

## 2016-09-01 NOTE — Telephone Encounter (Signed)
Spoke with patient he just wanted to let you know he will be dropping samples off at some point.

## 2016-09-01 NOTE — ED Notes (Signed)
Patient is still in CT at this time

## 2016-09-01 NOTE — Discharge Instructions (Signed)
Your lab work today is normal.  You CT showed some areas in the descending colon and proximal sigmoid colon with mild subjective wall thickening and subtle surrounding inflammatory changes which may suggest a mild colitis. You may start taking a probiotic +/- daily fiber such as metamucil.  You may take tylenol, motrin, advil for pain.  Follow up with your primary care doctor.  Return sooner if you experience sudden worsening pain, bloody stools, fever, or are unable to tolerate foods/liquids.

## 2016-09-01 NOTE — ED Provider Notes (Signed)
[facility] Provider Note   CSN: [id number] Arrival date & time: 09/01/16  0754     History   Chief Complaint Chief Complaint  Patient presents with  . Abdominal Pain    HPI Brian Esparza is a 35 y.o. male.  HPI Brian Esparza is a 35 y.o. male with PMH significant for asthma who presents with sudden onset, constant, moderate RLQ abdominal pain x 2 hours.  He describes it as "something weighing down in my stomach".  States he swallowed mucous and that's when his symptoms started.  No medications PTA.  Nothing makes it worse or better.  Associated symptoms include one episode of NBNB emesis.  Denies nausea, diarrhea, constipation, bloody stools, urinary symptoms, or fever.  No prior abdominal surgeries.  Endorses EtOH use, "casually, few times a week".  Denies medication changes. No recent travel or abx.   Past Medical History:  Diagnosis Date  . Allergy   . Asthma     There are no active problems to display for this patient.   Past Surgical History:  Procedure Laterality Date  . ANKLE SURGERY         Home Medications    Prior to Admission medications   Medication Sig Start Date End Date Taking? Authorizing Provider  albuterol (PROAIR HFA) 108 (90 Base) MCG/ACT inhaler Inhale 2 puffs into the lungs every 6 (six) hours as needed. 12/30/15  Yes Elvina Sidle, MD  benzonatate (TESSALON) 100 MG capsule Take 1-2 capsules (100-200 mg total) by mouth 3 (three) times daily as needed for cough. Patient not taking: Reported on 09/01/2016 08/24/16   Magdalene River, PA-C  HYDROcodone-homatropine [facility]) 5-1.5 MG/5ML syrup Take at night as needed for cough. 08/24/16   Magdalene River, PA-C    Family History No family history on file.  Social History Social History  Substance Use Topics  . Smoking status: Current Some Day Smoker    Packs/day: 0.00    Years: 0.00    Types: Cigarettes  . Smokeless tobacco: Never Used  . Alcohol use Yes     Comment:  occasionally     Allergies   Asa [aspirin]   Review of Systems Review of Systems All other systems negative unless otherwise stated in HPI   Physical Exam Updated Vital Signs BP 112/73 (BP Location: Right Arm)   Pulse (!) 58   Temp 98.3 F (36.8 C) (Oral)   Resp 17   Ht 5\' 7"  (1.702 m)   Wt 72.6 kg   SpO2 97%   BMI 25.06 kg/m   Physical Exam  Constitutional: He is oriented to person, place, and time. He appears well-developed and well-nourished.  Non-toxic appearance. He does not have a sickly appearance. He does not appear ill.  HENT:  Head: Normocephalic and atraumatic.  Mouth/Throat: Oropharynx is clear and moist.  Eyes: Conjunctivae are normal. Pupils are equal, round, and reactive to light.  Neck: Normal range of motion. Neck supple.  Cardiovascular: Normal rate and regular rhythm.   Pulmonary/Chest: Effort normal and breath sounds normal. No accessory muscle usage or stridor. No respiratory distress. He has no wheezes. He has no rhonchi. He has no rales.  Abdominal: Soft. Bowel sounds are normal. He exhibits no distension. There is tenderness in the right lower quadrant. There is tenderness at McBurney's point. There is no rebound and no guarding.  Musculoskeletal: Normal range of motion.  Lymphadenopathy:    He has no cervical adenopathy.  Neurological: He is alert and oriented to  person, place, and time.  Speech clear without dysarthria.  Skin: Skin is warm and dry.  Psychiatric: He has a normal mood and affect. His behavior is normal.     ED Treatments / Results  Labs (all labs ordered are listed, but only abnormal results are displayed) Labs Reviewed  COMPREHENSIVE METABOLIC PANEL - Abnormal; Notable for the following:       Result Value   ALT 11 (*)    All other components within normal limits  LIPASE, BLOOD  CBC WITH DIFFERENTIAL/PLATELET  URINALYSIS, ROUTINE W REFLEX MICROSCOPIC (NOT AT [facility])    EKG  EKG Interpretation None        Radiology Ct Abdomen Pelvis W Contrast  Result Date: 09/01/2016 CLINICAL DATA:  35 year old male with history of right lower quadrant abdominal pain today. Nausea. Vomiting. EXAM: CT ABDOMEN AND PELVIS WITH CONTRAST TECHNIQUE: Multidetector CT imaging of the abdomen and pelvis was performed using the standard protocol following bolus administration of intravenous contrast. CONTRAST:  100mL ISOVUE-300 IOPAMIDOL (ISOVUE-300) INJECTION 61% COMPARISON:  No priors. FINDINGS: Lower chest: Unremarkable. Hepatobiliary: No cystic or solid hepatic lesions. No intra or extrahepatic biliary ductal dilatation. Gallbladder is normal in appearance. Pancreas: No pancreatic mass. No pancreatic ductal dilatation. No pancreatic or peripancreatic fluid or inflammatory changes. Spleen: Unremarkable. Adrenals/Urinary Tract: Bilateral kidneys and bilateral adrenal glands are normal in appearance. No hydroureteronephrosis. Urinary bladder is normal in appearance. Stomach/Bowel: The appearance of the stomach is normal. There is no pathologic dilatation of small bowel or colon. Normal appendix. The distal descending colon and proximal sigmoid colon are decompressed. Allowing for this decompression, there is some very mild subjective wall thickening in the colon, which could suggest an underlying colitis. Slight haziness in the adjacent fat is also noted, suggesting some inflammation. Vascular/Lymphatic: No significant atherosclerotic disease, aneurysm or dissection identified in the abdominal or pelvic vasculature. No lymphadenopathy noted in the abdomen or pelvis. Reproductive: Prostate gland and seminal vesicles are unremarkable in appearance. Other: No significant volume of ascites.  No pneumoperitoneum. Musculoskeletal: There are no aggressive appearing lytic or blastic lesions noted in the visualized portions of the skeleton. IMPRESSION: 1. Normal appendix. 2. There are some areas in the descending colon and proximal sigmoid  colon with mild subjective wall thickening and subtle surrounding inflammatory changes which may suggest a mild colitis. These findings could be accentuated by under distention of the colon throughout these regions, however, and clinical correlation is recommended. Electronically Signed   By: Trudie Reedaniel  Entrikin M.D.   On: 09/01/2016 10:22    Procedures Procedures (including critical care time)  Medications Ordered in ED Medications  sodium chloride 0.9 % bolus 1,000 mL (0 mLs Intravenous Stopped 09/01/16 1000)    And  0.9 %  sodium chloride infusion (not administered)  morphine 4 MG/ML injection 4 mg (4 mg Intravenous Given 09/01/16 0838)  iopamidol (ISOVUE-300) 61 % injection (100 mLs  Contrast Given 09/01/16 1001)     Initial Impression / Assessment and Plan / ED Course  I have reviewed the triage vital signs and the nursing notes.  Pertinent labs & imaging results that were available during my care of the patient were reviewed by me and considered in my medical decision making (see chart for details).  Clinical Course   Patient presents with sudden onset RLQ abdominal pain with one episode of emesis.  VSS, NAD.  On exam, RLQ tenderness without rebound, guarding, or rigidity.  DDx includes urolithiasis, appendicitis, diverticulitis.  Will obtain labs, give fluids, and  morphine.   9 AM: mild improvement of pain.  Given localized tenderness of RLQ and McBurney's point will obtain CTAP.   11 AM: Labs without acute abnormalities.  CT scan shows mild colitis.  He has no fever, diarrhea, bloody stools, recent travel, or abx to suggest infectious etiology.  No indication for abx.  Recommend probiotic and symptomatic treatment.  Follow up PCP.  Return precautions discussed.  Stable for discharge.   Final Clinical Impressions(s) / ED Diagnoses   Final diagnoses:  Right lower quadrant abdominal pain    New Prescriptions New Prescriptions   No medications on file     Gwinda Maine 09/01/16 1112    Laurence Spates, MD 09/06/16 [phone]

## 2016-09-01 NOTE — ED Notes (Addendum)
Pt ambulated to the bathroom -- states "I am leaving and you are going to take out this IV" to EMT Sherita-- pt informed that he is not discharged at this time.

## 2016-09-01 NOTE — ED Triage Notes (Signed)
Pt c/o abd pain, started this am, still hurts-- pain is 4/10-- vomited x 1 this am. Denies any diarrhea.

## 2016-09-16 ENCOUNTER — Encounter (HOSPITAL_COMMUNITY): Payer: Self-pay | Admitting: Emergency Medicine

## 2016-09-16 ENCOUNTER — Emergency Department (HOSPITAL_COMMUNITY): Payer: BLUE CROSS/BLUE SHIELD

## 2016-09-16 ENCOUNTER — Emergency Department (HOSPITAL_COMMUNITY)
Admission: EM | Admit: 2016-09-16 | Discharge: 2016-09-16 | Disposition: A | Discharge status: Home / Self Care | Payer: BLUE CROSS/BLUE SHIELD | Attending: Emergency Medicine | Admitting: Emergency Medicine

## 2016-09-16 DIAGNOSIS — X509XXA Other and unspecified overexertion or strenuous movements or postures, initial encounter: Secondary | ICD-10-CM | POA: Diagnosis not present

## 2016-09-16 DIAGNOSIS — J45909 Unspecified asthma, uncomplicated: Secondary | ICD-10-CM | POA: Diagnosis not present

## 2016-09-16 DIAGNOSIS — F17210 Nicotine dependence, cigarettes, uncomplicated: Secondary | ICD-10-CM | POA: Diagnosis not present

## 2016-09-16 DIAGNOSIS — Y92009 Unspecified place in unspecified non-institutional (private) residence as the place of occurrence of the external cause: Secondary | ICD-10-CM | POA: Insufficient documentation

## 2016-09-16 DIAGNOSIS — M7918 Myalgia, other site: Secondary | ICD-10-CM

## 2016-09-16 DIAGNOSIS — Y9389 Activity, other specified: Secondary | ICD-10-CM | POA: Insufficient documentation

## 2016-09-16 DIAGNOSIS — R1011 Right upper quadrant pain: Secondary | ICD-10-CM | POA: Insufficient documentation

## 2016-09-16 DIAGNOSIS — Y999 Unspecified external cause status: Secondary | ICD-10-CM | POA: Insufficient documentation

## 2016-09-16 DIAGNOSIS — M791 Myalgia: Secondary | ICD-10-CM | POA: Insufficient documentation

## 2016-09-16 HISTORY — DX: Noninfective gastroenteritis and colitis, unspecified: K52.9

## 2016-09-16 LAB — COMPREHENSIVE METABOLIC PANEL
ALT: 13 U/L — ABNORMAL LOW (ref 17–63)
AST: 23 U/L (ref 15–41)
Albumin: 4.4 g/dL (ref 3.5–5.0)
Alkaline Phosphatase: 67 U/L (ref 38–126)
Anion gap: 9 (ref 5–15)
BUN: 14 mg/dL (ref 6–20)
CO2: 25 mmol/L (ref 22–32)
Calcium: 9.7 mg/dL (ref 8.9–10.3)
Chloride: 104 mmol/L (ref 101–111)
Creatinine, Ser: 1.1 mg/dL (ref 0.61–1.24)
GFR calc Af Amer: >60 mL/min (ref >60)
GFR calc non Af Amer: >60 mL/min (ref >60)
Glucose, Bld: 112 mg/dL — ABNORMAL HIGH (ref 65–99)
Potassium: 3.6 mmol/L (ref 3.5–5.1)
Sodium: 138 mmol/L (ref 135–145)
Total Bilirubin: 1.3 mg/dL — ABNORMAL HIGH (ref 0.3–1.2)
Total Protein: 7.4 g/dL (ref 6.5–8.1)

## 2016-09-16 LAB — CBC
HCT: 47.8 % (ref 39.0–52.0)
Hemoglobin: 16.3 g/dL (ref 13.0–17.0)
MCH: 30.5 pg (ref 26.0–34.0)
MCHC: 34.1 g/dL (ref 30.0–36.0)
MCV: 89.3 fL (ref 78.0–100.0)
Platelets: 249 10*3/uL (ref 150–400)
RBC: 5.35 MIL/uL (ref 4.22–5.81)
RDW: 12.8 % (ref 11.5–15.5)
WBC: 5.4 10*3/uL (ref 4.0–10.5)

## 2016-09-16 LAB — URINALYSIS, ROUTINE W REFLEX MICROSCOPIC
GLUCOSE, UA: NEGATIVE mg/dL
Ketones, ur: 15 mg/dL — AB
LEUKOCYTES UA: NEGATIVE
Nitrite: NEGATIVE
Protein, ur: NEGATIVE mg/dL
SPECIFIC GRAVITY, URINE: 1.037 — AB (ref 1.005–1.030)
pH: 5.5 (ref 5.0–8.0)

## 2016-09-16 LAB — URINE MICROSCOPIC-ADD ON

## 2016-09-16 LAB — LIPASE, BLOOD: Lipase: 29 U/L (ref 11–51)

## 2016-09-16 NOTE — Discharge Instructions (Signed)
Take tylenol and ibuprofen for pain. Your blood work and ultrasound looks normal today.  REturn for worsening symptoms, including fever, escalating pain, intractable vomiting or any other symptoms concerning to you.

## 2016-09-16 NOTE — ED Notes (Signed)
Patient transported to Ultrasound 

## 2016-09-16 NOTE — ED Provider Notes (Signed)
[facility] Provider Note   CSN: [id number] Arrival date & time: 09/16/16  1327     History   Chief Complaint Chief Complaint  Patient presents with  . Abdominal Pain    HPI Brian Esparza is a 35 y.o. male.  The history is provided by the patient.  Abdominal Pain   This is a new problem. The current episode started 12 to 24 hours ago. The problem occurs rarely. The problem has not changed since onset.Associated with: movement, coughing. The pain is located in the RUQ. The pain is moderate. Associated symptoms include anorexia. Pertinent negatives include fever, belching, diarrhea, hematochezia, melena, nausea, vomiting, constipation, dysuria, frequency and hematuria. The symptoms are aggravated by certain positions, activity and coughing. Nothing relieves the symptoms. Past workup includes CT scan. His past medical history does not include PUD, gallstones, GERD, ulcerative colitis, Crohn's disease or irritable bowel syndrome.  No prior abdominal surgeries.   35 year old male who presents with right upper quadrant abdominal pain onset yesterday evening at 10 PM. He was recently seen in the ED 2 weeks ago for right lower quadrant abdominal pain and bloating and had a CT scan that was negative with questionable colitis versus under distention of the colon. I taking probiotics with improvement in his symptoms. Did not have anything TEE yesterday evening and while at rest developed aching right upper quadrant abdominal pain worse with movement and coughing. Was heavy lifting earlier in the day moving furniture into a new home. No vomiting, fevers or chills, diarrhea, melena or hematochezia. Pain not associated with eating. He states that this is not the same pain that he felt 2 weeks ago when he was seen in the ED. Different pain than when he was in ED 2 weeks ago  Past Medical History:  Diagnosis Date  . Allergy   . Asthma   . Colitis     There are no active problems to display  for this patient.   Past Surgical History:  Procedure Laterality Date  . ANKLE SURGERY         Home Medications    Prior to Admission medications   Medication Sig Start Date End Date Taking? Authorizing Provider  albuterol (PROAIR HFA) 108 (90 Base) MCG/ACT inhaler Inhale 2 puffs into the lungs every 6 (six) hours as needed. 12/30/15   Elvina SidleKurt Lauenstein, MD  benzonatate (TESSALON) 100 MG capsule Take 1-2 capsules (100-200 mg total) by mouth 3 (three) times daily as needed for cough. Patient not taking: Reported on 09/01/2016 08/24/16   Magdalene RiverBrittany D Wiseman, PA-C  HYDROcodone-homatropine [facility](HYCODAN) 5-1.5 MG/5ML syrup Take 5mLs at night as needed for cough. 08/24/16   Magdalene RiverBrittany D Wiseman, PA-C    Family History History reviewed. No pertinent family history.  Social History Social History  Substance Use Topics  . Smoking status: Current Some Day Smoker    Packs/day: 0.50    Years: 0.00    Types: Cigarettes  . Smokeless tobacco: Never Used  . Alcohol use Yes     Comment: occasionally     Allergies   Asa [aspirin]   Review of Systems Review of Systems  Constitutional: Negative for fever.  Gastrointestinal: Positive for abdominal pain and anorexia. Negative for constipation, diarrhea, hematochezia, melena, nausea and vomiting.  Genitourinary: Negative for dysuria, frequency and hematuria.  All other systems reviewed and are negative.    Physical Exam Updated Vital Signs BP 106/64 (BP Location: Right Arm)   Pulse 67   Temp 98.1 F (36.7 C) (Oral)  Resp 18   SpO2 100%   Physical Exam Physical Exam  Nursing note and vitals reviewed. Constitutional: Well developed, well nourished, non-toxic, and in no acute distress Head: Normocephalic and atraumatic.  Mouth/Throat: Oropharynx is clear and moist.  Neck: Normal range of motion. Neck supple.  Cardiovascular: Normal rate and regular rhythm.   Pulmonary/Chest: Effort normal and breath sounds normal.  Abdominal: Soft. There  is RUQ tenderness. There is no rebound and no guarding. No CVA tenderness Musculoskeletal: Normal range of motion.  Neurological: Alert, no facial droop, fluent speech, moves all extremities symmetrically Skin: Skin is warm and dry.  Psychiatric: Cooperative   ED Treatments / Results  Labs (all labs ordered are listed, but only abnormal results are displayed) Labs Reviewed  COMPREHENSIVE METABOLIC PANEL - Abnormal; Notable for the following:       Result Value   Glucose, Bld 112 (*)    ALT 13 (*)    Total Bilirubin 1.3 (*)    All other components within normal limits  LIPASE, BLOOD  CBC  URINALYSIS, ROUTINE W REFLEX MICROSCOPIC (NOT AT [facility])    EKG  EKG Interpretation None       Radiology US Abdomen Limited Ruq  Result Date: 09/16/2016 CLINICAL DATA:  Right upper quadrant pain EXAM: US ABDOMEN LIMITED - RIGHT UPPER QUADRANT COMPARISON:  CT abdomen pelvis 09/01/2016 FINDINGS: Gallbladder: The gallbladder is contracted. No positive sonographic Eulah Pont sign was demonstrated by the sonographer. There is no gallbladder wall thickening or pericholecystic fluid. No stones were identified. Common bile duct: Diameter: 2.1 mm, normal Liver: No focal lesion identified. Within normal limits in parenchymal echogenicity. IMPRESSION: Contracted gallbladder without evidence of acute cholecystitis. Electronically Signed   By: Deatra Robinson M.D.   On: 09/16/2016 18:54    Procedures Procedures (including critical care time)  Medications Ordered in ED Medications - No data to display   Initial Impression / Assessment and Plan / ED Course  I have reviewed the triage vital signs and the nursing notes.  Pertinent labs & imaging results that were available during my care of the patient were reviewed by me and considered in my medical decision making (see chart for details).  Clinical Course   Presenting with right upper quadrant abdominal pain, worse with movement and coughing. Seems  musculoskeletal in nature given that he was doing heavy lifting earlier yesterday. His abdomen is overall soft and nonsurgical. A recent CT scan 2 weeks ago was reassuring. Unremarkable CBC, comp metabolic panel, lipase. No urinary symptoms to suggest kidney infection. A right upper quadrant ultrasound reveals no evidence of cholecystitis or cholelithiasis or other hepatobiliary pathology. I discussed supportive care management with over-the-counter medications. Strict return and follow-up instructions reviewed. He expressed understanding of all discharge instructions and felt comfortable with the plan of care.   Final Clinical Impressions(s) / ED Diagnoses   Final diagnoses:  RUQ pain  Musculoskeletal pain  Right upper quadrant pain    New Prescriptions New Prescriptions   No medications on file     Lavera Guise, MD 09/16/16 1946

## 2016-09-16 NOTE — ED Triage Notes (Signed)
Pt arrives via POV from home with RMQ abdominal pain since last night. Denies n/v/diarrhea/fever. States just not feeling well and no appetite. VSS.

## 2016-09-16 NOTE — ED Notes (Signed)
Pt advised of the delay and stated "man yall doing to much".  He stated he wanted to leave and I advised him it was worth waiting and if he left he would still get a bill.

## 2016-09-23 ENCOUNTER — Encounter: Payer: Self-pay | Admitting: Nurse Practitioner

## 2016-09-23 ENCOUNTER — Ambulatory Visit (INDEPENDENT_AMBULATORY_CARE_PROVIDER_SITE_OTHER): Payer: BLUE CROSS/BLUE SHIELD | Admitting: Nurse Practitioner

## 2016-09-23 ENCOUNTER — Other Ambulatory Visit: Payer: Self-pay | Admitting: *Deleted

## 2016-09-23 ENCOUNTER — Ambulatory Visit (INDEPENDENT_AMBULATORY_CARE_PROVIDER_SITE_OTHER)
Admission: RE | Admit: 2016-09-23 | Discharge: 2016-09-23 | Disposition: A | Discharge status: Home / Self Care | Payer: BLUE CROSS/BLUE SHIELD | Source: Ambulatory Visit | Attending: Nurse Practitioner | Admitting: Nurse Practitioner

## 2016-09-23 VITALS — BP 114/88 | HR 86 | Temp 98.0°F | Ht 67.0 in | Wt 155.0 lb

## 2016-09-23 DIAGNOSIS — K529 Noninfective gastroenteritis and colitis, unspecified: Secondary | ICD-10-CM | POA: Diagnosis not present

## 2016-09-23 DIAGNOSIS — R1084 Generalized abdominal pain: Secondary | ICD-10-CM | POA: Insufficient documentation

## 2016-09-23 DIAGNOSIS — S298XXA Other specified injuries of thorax, initial encounter: Secondary | ICD-10-CM

## 2016-09-23 DIAGNOSIS — S20211A Contusion of right front wall of thorax, initial encounter: Secondary | ICD-10-CM | POA: Diagnosis not present

## 2016-09-23 DIAGNOSIS — R0789 Other chest pain: Secondary | ICD-10-CM | POA: Diagnosis not present

## 2016-09-23 NOTE — Progress Notes (Unsigned)
Pre visit review using our clinic review tool, if applicable. No additional management support is needed unless otherwise documented below in the visit note. 

## 2016-09-23 NOTE — Progress Notes (Signed)
Pre visit review using our clinic review tool, if applicable. No additional management support is needed unless otherwise documented below in the visit note. 
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
## 2016-09-23 NOTE — Patient Instructions (Addendum)
May continue probiotic (florastor or curturelle) once a day. Normal x-ray, chest wall pain is as a result of rib contusion. Patient advised to use Tylenol or ibuprofen as often as needed for pain. Return stool samples to lab as soon as possible. Consider referral to GI if stool samples are normal.  Chronic Diarrhea Diarrhea is frequent loose and watery bowel movements. It can cause you to feel weak and dehydrated. Dehydration can cause you to become tired and thirsty and to have a dry mouth, decreased urination, and dark yellow urine. Diarrhea is a sign of another problem, most often an infection that will not last long. In most cases, diarrhea lasts 2-3 days. Diarrhea that lasts longer than 4 weeks is called long-lasting (chronic) diarrhea. It is important to treat your diarrhea as directed by your health care provider to lessen or prevent future episodes of diarrhea.  CAUSES  There are many causes of chronic diarrhea. The following are some possible causes:   Gastrointestinal infections caused by viruses, bacteria, or parasites.   Food poisoning or food allergies.   Certain medicines, such as antibiotics, chemotherapy, and laxatives.   Artificial sweeteners and fructose.   Digestive disorders, such as celiac disease and inflammatory bowel diseases.   Irritable bowel syndrome.  Some disorders of the pancreas.  Disorders of the thyroid.  Reduced blood flow to the intestines.  Cancer. Sometimes the cause of chronic diarrhea is unknown. RISK FACTORS  Having a severely weakened immune system, such as from HIV or AIDS.   Taking certain types of cancer-fighting drugs (such as with chemotherapy) or other medicines.   Having had a recent organ transplant.   Having a portion of the stomach or small bowel removed.   Traveling to countries where food and water supplies are often contaminated.  SYMPTOMS  In addition to frequent, loose stools, diarrhea may cause:   Cramping.    Abdominal pain.   Nausea.   Fever.  Fatigue.  Urgent need to use the bathroom.  Loss of bowel control. DIAGNOSIS  Your health care provider must take a careful history and perform a physical exam. Tests given are based on your symptoms and history. Tests may include:   Blood or stool tests. Three or more stool samples may be examined. Stool cultures may be used to test for bacteria or parasites.   X-rays.   A procedure in which a thin tube is inserted into the mouth or rectum (endoscopy). This allows the health care provider to look inside the intestine.  TREATMENT   Treatment is aimed at correcting the cause of the diarrhea when possible.  Diarrhea caused by an infection can often be treated with antibiotic medicines.  Diarrhea not caused by an infection may require you to take long-term medicine or have surgery. Specific treatment should be discussed with your health care provider.  If the cause cannot be determined, treatment aims to relieve symptoms and prevent dehydration. Serious health problems can occur if you do not maintain proper fluid levels. Treatment may include:  Taking an oral rehydration solution (ORS).  Not drinking beverages that contain caffeine (such as tea, coffee, and soft drinks).  Not drinking alcohol.  Maintaining well-balanced nutrition to help you recover faster. HOME CARE INSTRUCTIONS   Drink enough fluids to keep urine clear or pale yellow. Drink 1 cup (8 oz) of fluid for each diarrhea episode. Avoid fluids that contain simple sugars, fruit juices, whole milk products, and sodas. Hydrate with an ORS. You may purchase the ORS  or prepare it at home by mixing the following ingredients together:   - tsp (1.7-3  mL) table salt.   tsp (3  mL) baking soda.   tsp (1.7 mL) salt substitute containing potassium chloride.  1 tbsp (20 mL) sugar.  4.2 c (1 L) of water.   Certain foods and beverages may increase the speed at which food  moves through the gastrointestinal (GI) tract. These foods and beverages should be avoided. They include:  Caffeinated and alcoholic beverages.  High-fiber foods, such as raw fruits and vegetables, nuts, seeds, and whole grain breads and cereals.  Foods and beverages sweetened with sugar alcohols, such as xylitol, sorbitol, and mannitol.   Some foods may be well tolerated and may help thicken stool. These include:  Starchy foods, such as rice, toast, pasta, low-sugar cereal, oatmeal, grits, baked potatoes, crackers, and bagels.  Bananas.  Applesauce.  Add probiotic-rich foods to help increase healthy bacteria in the GI tract. These include yogurt and fermented milk products.  Wash your hands well after each diarrhea episode.  Only take over-the-counter or prescription medicines as directed by your health care provider.  Take a warm bath to relieve any burning or pain from frequent diarrhea episodes. SEEK MEDICAL CARE IF:   You are not urinating as often.  Your urine is a dark color.  You become very tired or dizzy.  You have severe pain in the abdomen or rectum.  Your have blood or pus in your stools.  Your stools look black and tarry. SEEK IMMEDIATE MEDICAL CARE IF:   You are unable to keep fluids down.  You have persistent vomiting.  You have blood in your stool.  Your stools are black and tarry.  You do not urinate in 6-8 hours, or there is only a small amount of very dark urine.  You have abdominal pain that increases or localizes.  You have weakness, dizziness, confusion, or lightheadedness.  You have a severe headache.  Your diarrhea gets worse or does not get better.  You have a fever or persistent symptoms for more than 2-3 days.  You have a fever and your symptoms suddenly get worse. MAKE SURE YOU:   Understand these instructions.  Will watch your condition.  Will get help right away if you are not doing well or get worse.   This  information is not intended to replace advice given to you by your health care provider. Make sure you discuss any questions you have with your health care provider.   Document Released: 02/26/2004 Document Revised: 12/11/2013 Document Reviewed: 05/31/2013 Elsevier Interactive Patient Education [employer]2016 Elsevier Inc.

## 2016-09-30 ENCOUNTER — Other Ambulatory Visit: Payer: BLUE CROSS/BLUE SHIELD

## 2016-09-30 ENCOUNTER — Telehealth: Payer: Self-pay | Admitting: Emergency Medicine

## 2016-09-30 NOTE — Telephone Encounter (Signed)
He was to return stool sample to lab ASAP and to f/up with GI when scheduled. He needs to return stool sample to lab ASAP if he has not done so. Also please check if he already has an appt with Gi, if not please have him scheduled. Thank you.

## 2016-09-30 NOTE — Telephone Encounter (Signed)
Pt called and asked that you call him back. That he saw something in his stool that he needs to talk to you about. After not eating for 2 days he had a stool tues and it was black and syrupy like. This morning he had another stool and it still had a little black in it. He is concerned and wants to know what to do. Please advise thanks.

## 2016-09-30 NOTE — Telephone Encounter (Signed)
Left message asking patient to call back----can talk with tamara if any questions

## 2016-09-30 NOTE — Telephone Encounter (Signed)
Routing to [place]---do you want patient to come for office visit or what instructions should I give him---please advise, thanks

## 2016-10-01 ENCOUNTER — Other Ambulatory Visit: Payer: Worker's Compensation

## 2016-10-01 DIAGNOSIS — K529 Noninfective gastroenteritis and colitis, unspecified: Secondary | ICD-10-CM

## 2016-10-01 DIAGNOSIS — R1084 Generalized abdominal pain: Secondary | ICD-10-CM

## 2016-10-04 LAB — FECAL LACTOFERRIN, QUANT: LACTOFERRIN: NEGATIVE

## 2016-10-05 ENCOUNTER — Telehealth: Payer: Self-pay | Admitting: *Deleted

## 2016-10-05 LAB — STOOL CULTURE

## 2016-10-05 NOTE — Telephone Encounter (Signed)
FYI:  Pt c/o migraines and his neck "locks up". He states he had a FCE with [facility]. He forgot to mention this at his 09/23/2016 OV. He states [place] and he [facility]

## 2016-10-05 NOTE — Telephone Encounter (Signed)
Ok, thanks for letting me know!

## 2016-10-05 NOTE — Progress Notes (Signed)
Normal results Follow-up with GI when scheduled.

## 2016-10-07 ENCOUNTER — Ambulatory Visit (INDEPENDENT_AMBULATORY_CARE_PROVIDER_SITE_OTHER): Payer: BLUE CROSS/BLUE SHIELD | Admitting: Internal Medicine

## 2016-10-07 ENCOUNTER — Encounter (INDEPENDENT_AMBULATORY_CARE_PROVIDER_SITE_OTHER): Payer: Self-pay

## 2016-10-07 ENCOUNTER — Encounter: Payer: Self-pay | Admitting: Internal Medicine

## 2016-10-07 VITALS — BP 110/70 | HR 74 | Ht 67.0 in | Wt 155.0 lb

## 2016-10-07 DIAGNOSIS — R935 Abnormal findings on diagnostic imaging of other abdominal regions, including retroperitoneum: Secondary | ICD-10-CM

## 2016-10-07 DIAGNOSIS — R1084 Generalized abdominal pain: Secondary | ICD-10-CM

## 2016-10-07 DIAGNOSIS — R197 Diarrhea, unspecified: Secondary | ICD-10-CM

## 2016-10-07 NOTE — Progress Notes (Signed)
HISTORY OF PRESENT ILLNESS:  Brian Esparza is a 35 y.o. male , former [employer] employee, who is referred by his primary care provider Brian Pennaharlotte Nche, NP with chief complaints of abdominal pain, diarrhea, and abnormal CT scan. The patient reports being in his usual state of health until approximately 3 months ago when he developed problems with mid abdominal discomfort which she describes as swelling. He would notice as approximate 2 times a week. Would last for most of the day. No obvious relieving or exacerbating factors. Around the same time he developed diarrhea. Describes 2 loose stools per day. He was evaluated in the emergency room. Reviewed. Laboratories from September 2017 reveal unremarkable comprehensive metabolic panel, CBC, and differential. Stool studies negative for enteric pathogens. Abdominal ultrasound revealed contracted gallbladder without other abnormalities. CT scan of the abdomen and pelvis with contrast revealed areas in the descending colon and proximal sigmoid colon with mild wall thickening and subtle surrounding inflammatory changes which may suggest mild colitis. Patient denies rectal bleeding or mucus. No weight loss. Cannot identify any exacerbating or relieving factors in terms of his diarrhea. Has not had empiric therapies.  REVIEW OF SYSTEMS:  All non-GI ROS negative except for headaches  Past Medical History:  Diagnosis Date  . Allergy   . Asthma   . Colitis     Past Surgical History:  Procedure Laterality Date  . ANKLE SURGERY Left     Social History Brian Esparza  reports that he has been smoking Cigarettes.  He has a 10.00 pack-year smoking history. He has never used smokeless tobacco. He reports that he drinks alcohol. He reports that he uses drugs, including Marijuana.  family history includes Hypertension in his maternal grandfather and maternal uncle; Stroke in his maternal grandfather.  Allergies  Allergen Reactions  . Asa [Aspirin] Rash        PHYSICAL EXAMINATION: Vital signs: BP 110/70 (BP Location: Left Arm, Patient Position: Sitting, Cuff Size: Normal)   Pulse 74   Ht 5\' 7"  (1.702 m)   Wt 155 lb (70.3 kg)   BMI 24.28 kg/m   Constitutional: generally well-appearing, no acute distress Psychiatric: alert and oriented x3, cooperative Eyes: extraocular movements intact, anicteric, conjunctiva pink Mouth: oral pharynx moist, no lesions Neck: supple no lymphadenopathy Cardiovascular: heart regular rate and rhythm, no murmur Lungs: clear to auscultation bilaterally Abdomen: soft, nontender, nondistended, no obvious ascites, no peritoneal signs, normal bowel sounds, no organomegaly Rectal:Deferred Extremities: no clubbing cyanosis or lower extremity edema bilaterally Skin: no lesions on visible extremities Neuro: No focal deficits. Normal DTRs  ASSESSMENT:  #671. 35 year old with 3 month history of intermittent abdominal discomfort, loose stools, and CT scan suggesting mild left-sided colitis. Rule out IBD.  PLAN:  #1. Recommended colonoscopy.The nature of the procedure, as well as the risks, benefits, and alternatives were carefully and thoroughly reviewed with the patient. Ample time for discussion and questions allowed. The patient understood, was satisfied, and agreed to proceed, but once to hold off until after the new year when he is more familiar with his health care benefits. He does agree however to present sooner if his signs or symptoms worsen in any significant fashion. #2. Patient declined symptomatic therapies in the interim   A copy of this consultation note has been sent to Ms. Esparza

## 2016-10-07 NOTE — Patient Instructions (Signed)
Call us back when you are ready to schedule a colonoscopy.

## 2016-12-03 ENCOUNTER — Ambulatory Visit: Payer: Self-pay | Admitting: Internal Medicine

## 2016-12-27 ENCOUNTER — Encounter: Payer: BLUE CROSS/BLUE SHIELD | Admitting: Nurse Practitioner

## 2016-12-28 ENCOUNTER — Ambulatory Visit (INDEPENDENT_AMBULATORY_CARE_PROVIDER_SITE_OTHER)
Admission: RE | Admit: 2016-12-28 | Discharge: 2016-12-28 | Disposition: A | Discharge status: Home / Self Care | Payer: PRIVATE HEALTH INSURANCE | Source: Ambulatory Visit | Attending: Nurse Practitioner | Admitting: Nurse Practitioner

## 2016-12-28 ENCOUNTER — Ambulatory Visit (INDEPENDENT_AMBULATORY_CARE_PROVIDER_SITE_OTHER): Payer: PRIVATE HEALTH INSURANCE | Admitting: Nurse Practitioner

## 2016-12-28 ENCOUNTER — Other Ambulatory Visit: Payer: Self-pay | Admitting: Nurse Practitioner

## 2016-12-28 ENCOUNTER — Encounter: Payer: Self-pay | Admitting: Nurse Practitioner

## 2016-12-28 VITALS — BP 102/60 | HR 67 | Temp 97.5°F | Resp 12 | Ht 67.0 in | Wt 154.0 lb

## 2016-12-28 DIAGNOSIS — R519 Headache, unspecified: Secondary | ICD-10-CM | POA: Insufficient documentation

## 2016-12-28 DIAGNOSIS — R51 Headache: Secondary | ICD-10-CM

## 2016-12-28 DIAGNOSIS — S20211A Contusion of right front wall of thorax, initial encounter: Secondary | ICD-10-CM | POA: Diagnosis not present

## 2016-12-28 DIAGNOSIS — Z1159 Encounter for screening for other viral diseases: Secondary | ICD-10-CM | POA: Insufficient documentation

## 2016-12-28 DIAGNOSIS — M542 Cervicalgia: Secondary | ICD-10-CM | POA: Insufficient documentation

## 2016-12-28 DIAGNOSIS — Z0000 Encounter for general adult medical examination without abnormal findings: Secondary | ICD-10-CM | POA: Diagnosis not present

## 2016-12-28 DIAGNOSIS — R0789 Other chest pain: Secondary | ICD-10-CM

## 2016-12-28 DIAGNOSIS — G8929 Other chronic pain: Secondary | ICD-10-CM

## 2016-12-28 DIAGNOSIS — S20212A Contusion of left front wall of thorax, initial encounter: Secondary | ICD-10-CM | POA: Diagnosis not present

## 2016-12-28 NOTE — Progress Notes (Signed)
Subjective:    Patient ID: Brian Esparza, male    DOB: 1981-07-04, 36 y.o.   MRN: [id number]  Patient presents today for complete physical and left chest wall pain after fall 3weeks ago.  HPI  Headache and Neck Pain: Onset after injury at work 02/02/2016. He was evaluated and treated by [facility] (part of workman's comp). Per patient, he was not able to return to work due to persistent headache and neck pain. Pain does not improvement with use of opoids or  muscle relaxants or NSAID's. He states pain is improved by marijuana only. He is requesting for second opinion for pain management. States he is unable to find a job at this time due to recommendations given by [facility] orth[facility] 07/21/2016.  Immunizations: (TDAP, Hep C screen, Pneumovax, Influenza, zoster)  Health Maintenance  Topic Date Due  . HIV Screening  12/26/2017*  . Flu Shot  12/28/2017*  . Tetanus Vaccine  12/28/2017*  *Topic was postponed. The date shown is not the original due date.   Diet:regular Weight:  Wt Readings from Last 3 Encounters:  12/28/16 154 lb (69.9 kg)  10/07/16 155 lb (70.3 kg)  09/23/16 155 lb (70.3 kg)   Exercise:none Fall Risk: Fall Risk  09/23/2016 08/24/2016  Falls in the past year? No No   Home Safety:hoem with girlfriend Depression/Suicide: Depression screen [facility] 2/9 09/23/2016 08/24/2016  Decreased Interest 0 0  Down, Depressed, Hopeless 0 0  PHQ - 2 Score 0 0   No flowsheet data found. Vision:up to date Dental:up to date Advanced Directive: Advanced Directives 09/23/2016  Does Patient Have a Medical Advance Directive? No  Would patient like information on creating a medical advance directive? Yes - Transport planner given   Sexual History (birth control, marital status, STD): sexually active, unprotected, single.  Medications and allergies reviewed with patient and updated if appropriate.  Patient Active Problem List   Diagnosis Date Noted  . Encounter for  preventative adult health care examination 12/28/2016  . Headache 12/28/2016  . Neck pain 12/28/2016  . Generalized abdominal pain 09/23/2016  . Chronic diarrhea 09/23/2016    Current Outpatient Prescriptions on File Prior to Visit  Medication Sig Dispense Refill  . albuterol (PROAIR HFA) 108 (90 Base) MCG/ACT inhaler Inhale 2 puffs into the lungs every 6 (six) hours as needed. 8.5 Inhaler 5  . meloxicam (MOBIC) 15 MG tablet Take 15 mg by mouth daily.  0   No current facility-administered medications on file prior to visit.     Past Medical History:  Diagnosis Date  . Allergy   . Asthma   . Colitis     Past Surgical History:  Procedure Laterality Date  . ANKLE SURGERY Left     Social History   Social History  . Marital status: Single    Spouse name: N/A  . Number of children: 0  . Years of education: N/A   Social History Main Topics  . Smoking status: Current Some Day Smoker    Packs/day: 0.50    Years: 20.00    Types: Cigarettes  . Smokeless tobacco: Never Used  . Alcohol use Yes     Comment: occasionally  . Drug use:     Types: Marijuana     Comment: for headaches/pain from job injury  . Sexual activity: No   Other Topics Concern  . None   Social History Narrative  . None    Family History  Problem Relation Age of Onset  . Hypertension Maternal  Grandfather   . Stroke Maternal Grandfather   . Hypertension Maternal Uncle   . Stroke Maternal Uncle   . Colon cancer Neg Hx         Review of Systems  Constitutional: Negative for fever, malaise/fatigue and weight loss.  HENT: Negative for congestion and sore throat.   Eyes:       Negative for visual changes  Respiratory: Negative for cough, hemoptysis, sputum production, shortness of breath and wheezing.   Cardiovascular: Positive for chest pain. Negative for palpitations, orthopnea and leg swelling.       Left lower chest wall tenderness with palpation and movement x 3weeks. Slipped and Fell in  shower 3weeks ago. Hit chest side against bath tub.   Gastrointestinal: Positive for diarrhea. Negative for abdominal pain, blood in stool, constipation, heartburn, melena, nausea and vomiting.       Hx of chronic diarrhea. No change, evaluated by Gi. Colonoscopy was recommended but he has not scheduled procedure.  Genitourinary: Negative for dysuria, frequency and urgency.  Musculoskeletal: Negative for falls, joint pain and myalgias.  Skin: Negative for rash.  Neurological: Negative for dizziness, tingling, tremors, sensory change, focal weakness, loss of consciousness and headaches.  Endo/Heme/Allergies: Does not bruise/bleed easily.  Psychiatric/Behavioral: Negative for depression, memory loss, substance abuse and suicidal ideas. The patient is not nervous/anxious.     Objective:   Vitals:   12/28/16 1020  BP: 102/60  Pulse: 67  Resp: 12  Temp: 97.5 F (36.4 C)    Body mass index is 24.12 kg/m.   Physical Examination:  Physical Exam  Constitutional: He is oriented to person, place, and time and well-developed, well-nourished, and in no distress. No distress.  HENT:  Right Ear: External ear normal.  Left Ear: External ear normal.  Nose: Nose normal.  Mouth/Throat: Oropharynx is clear and moist. No oropharyngeal exudate.  Eyes: Conjunctivae and EOM are normal. Pupils are equal, round, and reactive to light. No scleral icterus.  Neck: Normal range of motion. Neck supple. No thyromegaly present.  Cardiovascular: Normal rate, normal heart sounds and intact distal pulses.   Pulmonary/Chest: Effort normal and breath sounds normal. He exhibits tenderness.  left anterior chest wall tenderness, no bruising. Even cheat wall excursion.  Abdominal: Soft. Bowel sounds are normal. He exhibits no distension. There is no tenderness.  Musculoskeletal: Normal range of motion. He exhibits no edema or tenderness.  Lymphadenopathy:    He has no cervical adenopathy.  Neurological: He is  alert and oriented to person, place, and time. No cranial nerve deficit. Gait normal.  Skin: Skin is warm and dry.  Psychiatric: Affect and judgment normal.  Vitals reviewed.   ASSESSMENT and PLAN:  Brian Esparza was seen today for annual exam.  Diagnoses and all orders for this visit:  Encounter for preventative adult health care examination -     TSH; Future -     Lipid panel; Future  Left-sided chest wall pain -     Cancel: DG Ribs Unilateral Right; Future  Contusion of left chest wall, initial encounter -     Cancel: DG Ribs Unilateral Right; Future  Chronic nonintractable headache, unspecified headache type  Neck pain    No problem-specific Assessment & Plan notes found for this encounter.     Follow up: Return in about 1 year (around 12/28/2017).  Alysia Pennaharlotte Vadie Principato, NP

## 2016-12-28 NOTE — Progress Notes (Signed)
Pre visit review using our clinic review tool, if applicable. No additional management support is needed unless otherwise documented below in the visit note. 

## 2016-12-28 NOTE — Patient Instructions (Addendum)
Left chest wall and rib x-ray: normal. Use of tylenol and/or NSAIDs prn for pain.  Return to basement for lab draw tomorrow (need to fast at least 6-8hrs).  Need to sign medical release to get radiology report from [facility]. Will review reports prior to making any recommendations with regard to neck pain and headaches.

## 2017-01-31 ENCOUNTER — Telehealth: Payer: Self-pay | Admitting: Nurse Practitioner

## 2017-01-31 NOTE — Telephone Encounter (Signed)
Claris GowerCharlotte do you have his record? Please advise.

## 2017-01-31 NOTE — Telephone Encounter (Signed)
Patient is calling asking If you got his records. He stated once you got the records you was going to call him and give him the outcome of what you talked about at his appointment. He would really like for [facility] or the nurse to follow up with him as soon as possible. Thank you.

## 2017-02-01 NOTE — Telephone Encounter (Signed)
Spoke to medical records.  This was faxed over on 1/11.  Brian Esparza is going to fax again today.  Spoke with patient.  Suggested he try to get in [facility] to get these records himself.

## 2017-02-01 NOTE — Telephone Encounter (Signed)
Spoke with pt, he said he sing release form to get [facility] to come to us since 12/28/2016. What can we do to check on this because we do not have the record?   Please call pt when we find out.

## 2017-02-01 NOTE — Telephone Encounter (Signed)
No records received at this time.

## 2017-02-07 ENCOUNTER — Other Ambulatory Visit (INDEPENDENT_AMBULATORY_CARE_PROVIDER_SITE_OTHER): Payer: PRIVATE HEALTH INSURANCE

## 2017-02-07 DIAGNOSIS — Z0000 Encounter for general adult medical examination without abnormal findings: Secondary | ICD-10-CM | POA: Diagnosis not present

## 2017-02-07 LAB — LIPID PANEL
CHOLESTEROL: 115 mg/dL (ref 0–200)
HDL: 41.4 mg/dL (ref >39.00)
LDL CALC: 62 mg/dL (ref 0–99)
NonHDL: 73.74
TRIGLYCERIDES: 57 mg/dL (ref 0.0–149.0)
Total CHOL/HDL Ratio: 3
VLDL: 11.4 mg/dL (ref 0.0–40.0)

## 2017-02-07 LAB — TSH: TSH: 0.72 u[IU]/mL (ref 0.35–4.50)

## 2017-02-08 ENCOUNTER — Telehealth: Payer: Self-pay | Admitting: Nurse Practitioner

## 2017-02-08 NOTE — Progress Notes (Signed)
Normal results

## 2017-02-08 NOTE — Telephone Encounter (Signed)
Spoke with pt, she said they going to fax notes to our office today. Waiting for the records to come over.   [facility] phone # [phone]

## 2017-02-08 NOTE — Telephone Encounter (Signed)
Rec'd from [facility] forward 19 pages to Alysia Pennaharlotte Nche NP

## 2017-02-10 NOTE — Telephone Encounter (Signed)
[facility],req record for MRI or x-ray that pt had. Put record on charlotte's desk to review and made recommendation.

## 2017-02-16 ENCOUNTER — Telehealth: Payer: Self-pay | Admitting: Nurse Practitioner

## 2017-02-16 NOTE — Telephone Encounter (Signed)
Open in error

## 2017-02-17 ENCOUNTER — Telehealth: Payer: Self-pay | Admitting: Nurse Practitioner

## 2017-02-17 NOTE — Telephone Encounter (Signed)
Left vm for pt to call back, need to inform massage below.   Anne Ngharlotte Lum Nche, NP  Giang Hemme, LPN    Please inform patient that I received his MRI results and [facility].  According to Dr. Shon BatonBrooks notes, he was to follow up with neurologist. I will need notes from neurologist as well.  If he is unable to obtain neurologist notes, I will enter another referral to neurologist.  In the meantime, he can use oral NSAIDs and muscle relaxant prn to manage symptoms (neck pain and headache).  Thank you

## 2017-02-17 NOTE — Telephone Encounter (Signed)
Unfortunately I will not be able to make any addition recommendations until I get records from neurologist.

## 2017-02-17 NOTE — Telephone Encounter (Signed)
Patient called back.  He is going to get notes from neurologist and hand deliver.  Patient states that other medications does not work and he will not take until Brian Esparza can prescribe him the medication he is requesting.

## 2017-03-22 ENCOUNTER — Telehealth: Payer: Self-pay | Admitting: Nurse Practitioner

## 2017-03-22 DIAGNOSIS — M542 Cervicalgia: Secondary | ICD-10-CM

## 2017-03-22 MED ORDER — GABAPENTIN 100 MG PO CAPS: 100.0000 mg | ORAL_CAPSULE | Freq: Three times a day (TID) | ORAL | 3 refills | Status: DC

## 2017-03-22 NOTE — Telephone Encounter (Signed)
Please advise. Put record on your desk last week.

## 2017-03-22 NOTE — Telephone Encounter (Signed)
Pt verbalized understand and appt is set for 5/1

## 2017-03-22 NOTE — Telephone Encounter (Signed)
Pt states he brought notes in from Neurologists and would like a medication to help his pain. He does not want a muscle relaxer.

## 2017-04-19 ENCOUNTER — Encounter: Payer: Self-pay | Admitting: Nurse Practitioner

## 2017-04-19 ENCOUNTER — Ambulatory Visit (INDEPENDENT_AMBULATORY_CARE_PROVIDER_SITE_OTHER): Payer: PRIVATE HEALTH INSURANCE | Admitting: Nurse Practitioner

## 2017-04-19 VITALS — BP 102/68 | HR 64 | Temp 97.6°F | Ht 67.0 in | Wt 149.1 lb

## 2017-04-19 DIAGNOSIS — M542 Cervicalgia: Secondary | ICD-10-CM | POA: Diagnosis not present

## 2017-04-19 DIAGNOSIS — K529 Noninfective gastroenteritis and colitis, unspecified: Secondary | ICD-10-CM

## 2017-04-19 DIAGNOSIS — R1084 Generalized abdominal pain: Secondary | ICD-10-CM

## 2017-04-19 NOTE — Progress Notes (Signed)
Pre visit review using our clinic review tool, if applicable. No additional management support is needed unless otherwise documented below in the visit note. 

## 2017-04-19 NOTE — Progress Notes (Signed)
Subjective:  Patient ID: Brian Esparza, male    DOB: Dec 05, 1981  Age: 36 y.o. MRN: [id number]  CC: Follow-up (Neck pain wants to talk about options)    HPI Chronic Neck pain: No improvement with gabapentin. Took medication for 2weeks, then stopped. He is asking for marijuana card. He states due to previous neck injury, he is unable to lift over 30Lbs. He is able to exercise (running and playing basketball) but avoids lifting weights in the gym. Denies any new symptoms.  Diarrhea and ABD pain has resolved with change in diet and increased fluid intake.  Outpatient Medications Prior to Visit  Medication Sig Dispense Refill  . albuterol (PROAIR HFA) 108 (90 Base) MCG/ACT inhaler Inhale 2 puffs into the lungs every 6 (six) hours as needed. 8.5 Inhaler 5  . gabapentin (NEURONTIN) 100 MG capsule Take 1 capsule (100 mg total) by mouth 3 (three) times daily. 90 capsule 3  . meloxicam (MOBIC) 15 MG tablet Take 15 mg by mouth daily.  0   No facility-administered medications prior to visit.     ROS See HPI  Objective:  BP 102/68 (BP Location: Left Arm, Patient Position: Sitting, Cuff Size: Normal)   Pulse 64   Temp 97.6 F (36.4 C) (Oral)   Ht  (1.702 m)   Wt 149 lb 1.9 oz (67.6 kg)   SpO2 99%   BMI 23.36 kg/m   BP Readings from Last 3 Encounters:  04/19/17 102/68  12/28/16 102/60  10/07/16 110/70    Wt Readings from Last 3 Encounters:  04/19/17 149 lb 1.9 oz (67.6 kg)  12/28/16 154 lb (69.9 kg)  10/07/16 155 lb (70.3 kg)    Physical Exam  Constitutional: He is oriented to person, place, and time. No distress.  Neck: Normal range of motion. Neck supple. No thyromegaly present.  Cardiovascular: Normal rate, regular rhythm and normal heart sounds.   Pulmonary/Chest: Effort normal and breath sounds normal.  Abdominal: Soft. Bowel sounds are normal. He exhibits no distension. There is no tenderness.  Musculoskeletal: Normal range of motion. He exhibits no edema or  tenderness.  Lymphadenopathy:    He has no cervical adenopathy.  Neurological: He is alert and oriented to person, place, and time.  Skin: Skin is warm and dry.  Vitals reviewed.   Lab Results  Component Value Date   WBC 5.4 09/16/2016   HGB 16.3 09/16/2016   HCT 47.8 09/16/2016   PLT 249 09/16/2016   GLUCOSE 112 (H) 09/16/2016   CHOL 115 02/07/2017   TRIG 57.0 02/07/2017   HDL 41.40 02/07/2017   LDLCALC 62 02/07/2017   ALT 13 (L) 09/16/2016   AST 23 09/16/2016   NA 138 09/16/2016   K 3.6 09/16/2016   CL 104 09/16/2016   CREATININE 1.10 09/16/2016   BUN 14 09/16/2016   CO2 25 09/16/2016   TSH 0.72 02/07/2017    Dg Ribs Unilateral Left  Result Date: 12/28/2016 CLINICAL DATA:  Fall 3 weeks ago, left rib pain EXAM: LEFT RIBS - 2 VIEW COMPARISON:  None FINDINGS: Three views left ribs submitted. No infiltrate or pulmonary edema. No left rib fracture is identified. No pneumothorax. IMPRESSION: Negative. Electronically Signed   By: Natasha Mead M.D.   On: 12/28/2016 11:41    Assessment & Plan:   Rece was seen today for follow-up.  Diagnoses and all orders for this visit:  Neck pain  Generalized abdominal pain  Chronic diarrhea   I am having [name] maintain his  albuterol, meloxicam, and gabapentin.  No orders of the defined types were placed in this encounter.   Follow-up: Return if symptoms worsen or fail to improve.  Alysia Penna, NP

## 2017-04-19 NOTE — Patient Instructions (Addendum)
He is unsure about use of another medication or change to medication, there I asked patient to call office when he decide the course of treatment with medications. Advise patient that marijuana has not been legalized in [place] and it is not prescribed for musculoskele Let me know if you will like to increase gabapentin dose or switch to cymbalta or referral to pain clinic or orthopedic (spine specialist).

## 2020-05-10 ENCOUNTER — Ambulatory Visit
Admission: EM | Admit: 2020-05-10 | Discharge: 2020-05-10 | Disposition: A | Discharge status: Home / Self Care | Payer: PRIVATE HEALTH INSURANCE | Attending: Emergency Medicine | Admitting: Emergency Medicine

## 2020-05-10 ENCOUNTER — Other Ambulatory Visit: Payer: Self-pay

## 2020-05-10 ENCOUNTER — Encounter: Payer: Self-pay | Admitting: Emergency Medicine

## 2020-05-10 DIAGNOSIS — Z113 Encounter for screening for infections with a predominantly sexual mode of transmission: Secondary | ICD-10-CM | POA: Insufficient documentation

## 2020-05-10 DIAGNOSIS — Z202 Contact with and (suspected) exposure to infections with a predominantly sexual mode of transmission: Secondary | ICD-10-CM | POA: Insufficient documentation

## 2020-05-10 MED ORDER — METRONIDAZOLE 500 MG PO TABS
2000.0000 mg | ORAL_TABLET | Freq: Once | ORAL | Status: AC
  Administered 2020-05-10: 2000 mg via ORAL

## 2020-05-10 NOTE — ED Provider Notes (Signed)
[facility]    CSN: [id number] Arrival date & time: 05/10/20  2706      History   Chief Complaint Chief Complaint  Patient presents with  . SEXUALLY TRANSMITTED DISEASE    HPI Brian Esparza is a 39 y.o. male with history of allergies, asthma presenting for STI check.  Patient denies penile or testicular pain, swelling, penile discharge, dysuria, abdominal or back pain, fever.  States partner has trichomoniasis.  Patient currently sexually active with 1 male partner, not routinely using condoms.   Past Medical History:  Diagnosis Date  . Allergy   . Asthma   . Colitis     Patient Active Problem List   Diagnosis Date Noted  . Encounter for preventative adult health care examination 12/28/2016  . Headache 12/28/2016  . Neck pain 12/28/2016    Past Surgical History:  Procedure Laterality Date  . ANKLE SURGERY Left        Home Medications    Prior to Admission medications   Medication Sig Start Date End Date Taking? Authorizing Provider  albuterol (PROAIR HFA) 108 (90 Base) MCG/ACT inhaler Inhale 2 puffs into the lungs every 6 (six) hours as needed. 12/30/15 05/10/20  Robyn Haber, MD  gabapentin (NEURONTIN) 100 MG capsule Take 1 capsule (100 mg total) by mouth 3 (three) times daily. 03/22/17 05/10/20  Nche, Charlene Brooke, NP    Family History Family History  Problem Relation Age of Onset  . Hypertension Maternal Grandfather   . Stroke Maternal Grandfather   . Hypertension Maternal Uncle   . Stroke Maternal Uncle   . Colon cancer Neg Hx     Social History Social History   Tobacco Use  . Smoking status: Current Some Day Smoker    Packs/day: 0.50    Years: 20.00    Pack years: 10.00    Types: Cigarettes  . Smokeless tobacco: Never Used  Substance Use Topics  . Alcohol use: Yes    Comment: occasionally  . Drug use: Yes    Types: Marijuana    Comment: for headaches/pain from job injury     [place] [aspirin]   Review of  Systems As per HPI   Physical Exam Triage Vital Signs ED Triage Vitals  Enc Vitals Group     BP      Pulse      Resp      Temp      Temp src      SpO2      Weight      Height      Head Circumference      Peak Flow      Pain Score      Pain Loc      Pain Edu?      Excl. in [place]?    No data found.  Updated Vital Signs BP 117/79 (BP Location: Left Arm)   Pulse 67   Temp 97.7 F (36.5 C) (Oral)   Resp 18   SpO2 98%   Visual Acuity Right Eye Distance:   Left Eye Distance:   Bilateral Distance:    Right Eye Near:   Left Eye Near:    Bilateral Near:     Physical Exam Constitutional:      General: He is not in acute distress. HENT:     Head: Normocephalic and atraumatic.  Eyes:     General: No scleral icterus.    Pupils: Pupils are equal, round, and reactive to light.  Cardiovascular:     Rate and Rhythm: Normal rate.  Pulmonary:     Effort: Pulmonary effort is normal. No respiratory distress.     Breath sounds: No wheezing.  Genitourinary:    Comments: Deferred: Self swab performed Skin:    Coloration: Skin is not jaundiced or pale.  Neurological:     Mental Status: He is alert and oriented to person, place, and time.      UC Treatments / Results  Labs (all labs ordered are listed, but only abnormal results are displayed) Labs Reviewed  CYTOLOGY, (ORAL, ANAL, URETHRAL) ANCILLARY ONLY    EKG   Radiology No results found.  Procedures Procedures (including critical care time)  Medications Ordered in UC Medications  metroNIDAZOLE (FLAGYL) tablet 2,000 mg (has no administration in time range)    Initial Impression / Assessment and Plan / UC Course  I have reviewed the triage vital signs and the nursing notes.  Pertinent labs & imaging results that were available during my care of the patient were reviewed by me and considered in my medical decision making (see chart for details).     Patient afebrile, nontoxic in office today.  Appears  well.  Patient with exposure to trichomonas: 2 g Flagyl given which patient tolerated well.  Cytology pending: We will treat coinfection if indicated.  Reviewed safe sex protocol as outlined below.  Return precautions discussed, patient verbalized understanding and is agreeable to plan. Final Clinical Impressions(s) / UC Diagnoses   Final diagnoses:  Exposure to trichomonas  Screening examination for venereal disease     Discharge Instructions     Today you received treatment for trichomonas. Testing for chlamydia, gonorrhea, trichomonas is pending: please look for these results on the MyChart app/website.  We will notify you if you are positive and outline treatment at that time.  Important to avoid all forms of sexual intercourse (oral, vaginal, anal) with any/all partners for the next 7 days to avoid spreading/reinfecting. Any/all sexual partners should be notified of testing/treatment today.  Return for persistent/worsening symptoms or if you develop fever, abdominal or pelvic pain, blood in your urine, or are re-exposed to an STI.    ED Prescriptions    None     PDMP not reviewed this encounter.   Odette Fraction [place], [place] 05/10/20 [phone]

## 2020-05-10 NOTE — Discharge Instructions (Addendum)
Today you received treatment for trichomonas. Testing for chlamydia, gonorrhea, trichomonas is pending: please look for these results on the MyChart app/website.  We will notify you if you are positive and outline treatment at that time.  Important to avoid all forms of sexual intercourse (oral, vaginal, anal) with any/all partners for the next 7 days to avoid spreading/reinfecting. Any/all sexual partners should be notified of testing/treatment today.  Return for persistent/worsening symptoms or if you develop fever, abdominal or pelvic pain, blood in your urine, or are re-exposed to an STI. 

## 2020-05-10 NOTE — ED Triage Notes (Signed)
Concerned for std.  Patient denies any symptoms.  Partner has Therapist, occupational

## 2020-05-13 LAB — CYTOLOGY, (ORAL, ANAL, URETHRAL) ANCILLARY ONLY
Chlamydia: NEGATIVE
Comment: NEGATIVE
Comment: NEGATIVE
Comment: NORMAL
Neisseria Gonorrhea: NEGATIVE
Trichomonas: POSITIVE — AB

## 2020-05-20 ENCOUNTER — Telehealth: Payer: Self-pay | Admitting: Emergency Medicine

## 2020-05-20 NOTE — Telephone Encounter (Signed)
Called pt and given results; pt verbalized understanding

## 2020-07-25 ENCOUNTER — Ambulatory Visit
Admission: EM | Admit: 2020-07-25 | Discharge: 2020-07-25 | Disposition: A | Discharge status: Home / Self Care | Payer: Self-pay | Attending: Physician Assistant | Admitting: Physician Assistant

## 2020-07-25 ENCOUNTER — Encounter: Payer: Self-pay | Admitting: Physician Assistant

## 2020-07-25 DIAGNOSIS — R0789 Other chest pain: Secondary | ICD-10-CM

## 2020-07-25 MED ORDER — TIZANIDINE HCL 2 MG PO TABS: 2.0000 mg | ORAL_TABLET | Freq: Three times a day (TID) | ORAL | 0 refills | Status: DC | PRN

## 2020-07-25 NOTE — ED Provider Notes (Signed)
[facility]    CSN: [id number] Arrival date & time: 07/25/20  1642      History   Chief Complaint Chief Complaint  Patient presents with  . Chest Pain    HPI Brian Esparza is a 39 y.o. male.   39 year old male comes in for 1 month history of intermittent chest pain and now with 2 day of constant pain. States usually last for 1 hour, and occurs at work where is hot, with bad circulation. Denies strenuous activity at work.  States now pain is constant, sharp in sensation, worse with inspiration. Pain is to the right chest, non-radiating. Denies associated shortness of breath, nausea/vomiting, diaphoresis. Denies URI symptoms, fever.  Denies exertional fatigue, exertional chest pain, dyspnea on exertion.  Current smoker, 0.5 pack/day.  History of asthma, last needed inhaler in 2017.  Denies personal history of heart disease.  Maternal grandfather with MI, unknown age of onset.     Past Medical History:  Diagnosis Date  . Allergy   . Asthma   . Colitis     Patient Active Problem List   Diagnosis Date Noted  . Encounter for preventative adult health care examination 12/28/2016  . Headache 12/28/2016  . Neck pain 12/28/2016    Past Surgical History:  Procedure Laterality Date  . ANKLE SURGERY Left        Home Medications    Prior to Admission medications   Medication Sig Start Date End Date Taking? Authorizing Provider  tiZANidine (ZANAFLEX) 2 MG tablet Take 1 tablet (2 mg total) by mouth every 8 (eight) hours as needed for muscle spasms. 07/25/20   Cathie Hoops, Augustino Savastano V, PA-C  albuterol (PROAIR HFA) 108 (90 Base) MCG/ACT inhaler Inhale 2 puffs into the lungs every 6 (six) hours as needed. 12/30/15 05/10/20  Elvina Sidle, MD  gabapentin (NEURONTIN) 100 MG capsule Take 1 capsule (100 mg total) by mouth 3 (three) times daily. 03/22/17 05/10/20  Nche, Bonna Gains, NP    Family History Family History  Problem Relation Age of Onset  . Hypertension Maternal Grandfather     . Stroke Maternal Grandfather   . Hypertension Maternal Uncle   . Stroke Maternal Uncle   . Colon cancer Neg Hx     Social History Social History   Tobacco Use  . Smoking status: Current Some Day Smoker    Packs/day: 0.50    Years: 20.00    Pack years: 10.00    Types: Cigarettes  . Smokeless tobacco: Never Used  Substance Use Topics  . Alcohol use: Yes    Comment: occasionally  . Drug use: Yes    Types: Marijuana    Comment: for headaches/pain from job injury     Allergies   Asa [aspirin]   Review of Systems Review of Systems  Reason unable to perform ROS: See HPI as above.     Physical Exam Triage Vital Signs ED Triage Vitals [07/25/20 1657]  Enc Vitals Group     BP 125/75     Pulse Rate 71     Resp 16     Temp 98.1 F (36.7 C)     Temp Source Oral     SpO2 97 %     Weight      Height      Head Circumference      Peak Flow      Pain Score      Pain Loc      Pain Edu?  Excl. in GC?    No data found.  Updated Vital Signs BP 125/75 (BP Location: Left Arm)   Pulse 71   Temp 98.1 F (36.7 C) (Oral)   Resp 16   SpO2 97%   Physical Exam Constitutional:      General: He is not in acute distress.    Appearance: Normal appearance. He is well-developed. He is not toxic-appearing or diaphoretic.  HENT:     Head: Normocephalic and atraumatic.  Eyes:     Conjunctiva/sclera: Conjunctivae normal.     Pupils: Pupils are equal, round, and reactive to light.  Cardiovascular:     Rate and Rhythm: Normal rate and regular rhythm.  Pulmonary:     Effort: Pulmonary effort is normal. No respiratory distress.     Comments: LCTAB Chest:     Comments: Right sided chest tenderness to palpation Musculoskeletal:     Cervical back: Normal range of motion and neck supple.     Comments: Full ROM of BUE, with abduction causing pain/pulling of right chest. Strength 5/5. Sensation intact. Radial pulse 2+  Skin:    General: Skin is warm and dry.  Neurological:      Mental Status: He is alert and oriented to person, place, and time.      UC Treatments / Results  Labs (all labs ordered are listed, but only abnormal results are displayed) Labs Reviewed - No data to display  EKG   Radiology No results found.  Procedures Procedures (including critical care time)  Medications Ordered in UC Medications - No data to display  Initial Impression / Assessment and Plan / UC Course  I have reviewed the triage vital signs and the nursing notes.  Pertinent labs & imaging results that were available during my care of the patient were reviewed by me and considered in my medical decision making (see chart for details).    Right-sided chest pain reproducible by palpation.  Patient with allergy to ASA, but able to tolerate ibuprofen.  Take ibuprofen for symptoms, tizanidine as needed.  Return precautions given.   Final Clinical Impressions(s) / UC Diagnoses   Final diagnoses:  Right-sided chest wall pain   ED Prescriptions    Medication Sig Dispense Auth. Provider   tiZANidine (ZANAFLEX) 2 MG tablet Take 1 tablet (2 mg total) by mouth every 8 (eight) hours as needed for muscle spasms. 15 tablet Belinda Fisher, PA-C     PDMP not reviewed this encounter.   Belinda Fisher, PA-C 07/25/20 1733

## 2020-07-25 NOTE — ED Triage Notes (Signed)
Pt c/o right sided CP for past two days, reproducible on palpation; exacerbated with movement. Denies SOB, dizziness, diaphoresis, or other c/o.

## 2020-07-25 NOTE — Discharge Instructions (Signed)
No worries for your heart at this time. Your exam more consistent with muscle pain. Ibuprofen 800mg  three times a day. Tizanidine as needed, this can make you drowsy, so do not take if you are going to drive, operate heavy machinery, or make important decisions. Ice/heat compresses as needed. Follow up with PCP if symptoms not improving. If sudden worsening of symptoms, short of breath, nausea/vomiting, sweating/passing out, go to the emergency department for further evaluation.

## 2020-07-31 ENCOUNTER — Other Ambulatory Visit: Payer: Self-pay

## 2020-07-31 ENCOUNTER — Ambulatory Visit
Admission: EM | Admit: 2020-07-31 | Discharge: 2020-07-31 | Disposition: A | Discharge status: Home / Self Care | Payer: Self-pay

## 2020-07-31 DIAGNOSIS — R0789 Other chest pain: Secondary | ICD-10-CM

## 2020-07-31 DIAGNOSIS — Z09 Encounter for follow-up examination after completed treatment for conditions other than malignant neoplasm: Secondary | ICD-10-CM

## 2020-07-31 NOTE — ED Provider Notes (Signed)
39 year old male returns to clinic for continued chest pain after being seen 07/25/2020 for 1+ month of symptoms. At the time, had chest wall tenderness and prescribed tizanidine. Patient has not started medicine, stating he knows it is not his muscles. States symptoms occur mostly at work, where it is hot, with strenuous activity and chemical exposure. Discussed will need further evaluation with PCP given chronic atypical chest pain. Will provide work note as patient symptoms mostly at work.    Belinda Fisher, PA-C 07/31/20 1607

## 2020-07-31 NOTE — ED Triage Notes (Addendum)
Pt states seen here on Friday for chest pain and told to come in for follow up today. States has right upper chest pain while at work only. States works around [employer] wear a mask.

## 2020-07-31 NOTE — Discharge Instructions (Signed)
As discussed, follow up with PCP for further evaluation.

## 2020-08-26 ENCOUNTER — Other Ambulatory Visit: Payer: Self-pay

## 2020-08-27 ENCOUNTER — Ambulatory Visit (INDEPENDENT_AMBULATORY_CARE_PROVIDER_SITE_OTHER): Payer: No Typology Code available for payment source | Admitting: Family Medicine

## 2020-08-27 ENCOUNTER — Ambulatory Visit (INDEPENDENT_AMBULATORY_CARE_PROVIDER_SITE_OTHER): Payer: No Typology Code available for payment source

## 2020-08-27 ENCOUNTER — Encounter: Payer: Self-pay | Admitting: Family Medicine

## 2020-08-27 VITALS — BP 108/70 | HR 78 | Temp 97.1°F | Ht 67.0 in | Wt 170.6 lb

## 2020-08-27 DIAGNOSIS — R079 Chest pain, unspecified: Secondary | ICD-10-CM | POA: Diagnosis not present

## 2020-08-27 DIAGNOSIS — Z720 Tobacco use: Secondary | ICD-10-CM | POA: Diagnosis not present

## 2020-08-27 DIAGNOSIS — Z0000 Encounter for general adult medical examination without abnormal findings: Secondary | ICD-10-CM

## 2020-08-27 DIAGNOSIS — R519 Headache, unspecified: Secondary | ICD-10-CM | POA: Diagnosis not present

## 2020-08-27 DIAGNOSIS — H6122 Impacted cerumen, left ear: Secondary | ICD-10-CM | POA: Diagnosis not present

## 2020-08-27 LAB — COMPREHENSIVE METABOLIC PANEL
ALT: 38 U/L (ref 0–53)
AST: 33 U/L (ref 0–37)
Albumin: 4.4 g/dL (ref 3.5–5.2)
Alkaline Phosphatase: 58 U/L (ref 39–117)
BUN: 10 mg/dL (ref 6–23)
CO2: 31 mEq/L (ref 19–32)
Calcium: 9.4 mg/dL (ref 8.4–10.5)
Chloride: 103 mEq/L (ref 96–112)
Creatinine, Ser: 0.95 mg/dL (ref 0.40–1.50)
GFR: 106.34 mL/min (ref >60.00)
Glucose, Bld: 84 mg/dL (ref 70–99)
Potassium: 4.3 mEq/L (ref 3.5–5.1)
Sodium: 139 mEq/L (ref 135–145)
Total Bilirubin: 0.6 mg/dL (ref 0.2–1.2)
Total Protein: 7.2 g/dL (ref 6.0–8.3)

## 2020-08-27 LAB — URINALYSIS, ROUTINE W REFLEX MICROSCOPIC
Bilirubin Urine: NEGATIVE
Hgb urine dipstick: NEGATIVE
Ketones, ur: NEGATIVE
Leukocytes,Ua: NEGATIVE
Nitrite: NEGATIVE
RBC / HPF: NONE SEEN (ref 0–?)
Specific Gravity, Urine: 1.02 (ref 1.000–1.030)
Total Protein, Urine: NEGATIVE
Urine Glucose: NEGATIVE
Urobilinogen, UA: 0.2 (ref 0.0–1.0)
WBC, UA: NONE SEEN (ref 0–?)
pH: 7.5 (ref 5.0–8.0)

## 2020-08-27 LAB — LIPID PANEL
Cholesterol: 155 mg/dL (ref 0–200)
HDL: 57.7 mg/dL (ref >39.00)
LDL Cholesterol: 85 mg/dL (ref 0–99)
NonHDL: 97.46
Total CHOL/HDL Ratio: 3
Triglycerides: 62 mg/dL (ref 0.0–149.0)
VLDL: 12.4 mg/dL (ref 0.0–40.0)

## 2020-08-27 LAB — CBC
HCT: 39.9 % (ref 39.0–52.0)
Hemoglobin: 13.3 g/dL (ref 13.0–17.0)
MCHC: 33.4 g/dL (ref 30.0–36.0)
MCV: 90.1 fl (ref 78.0–100.0)
Platelets: 221 10*3/uL (ref 150.0–400.0)
RBC: 4.43 Mil/uL (ref 4.22–5.81)
RDW: 13.4 % (ref 11.5–15.5)
WBC: 7.6 10*3/uL (ref 4.0–10.5)

## 2020-08-27 MED ORDER — DEBROX 6.5 % OT SOLN: 5.0000 [drp] | Freq: Two times a day (BID) | OTIC | 0 refills | Status: DC

## 2020-08-27 NOTE — Patient Instructions (Addendum)
Coping with Quitting Smoking  Quitting smoking is a physical and mental challenge. You will face cravings, withdrawal symptoms, and temptation. Before quitting, work with your health care provider to make a plan that can help you cope. Preparation can help you quit and keep you from giving in. How can I cope with cravings? Cravings usually last for 5-10 minutes. If you get through it, the craving will pass. Consider taking the following actions to help you cope with cravings:  Keep your mouth busy: ? Chew sugar-free gum. ? Suck on hard candies or a straw. ? Brush your teeth.  Keep your hands and body busy: ? Immediately change to a different activity when you feel a craving. ? Squeeze or play with a ball. ? Do an activity or a hobby, like making bead jewelry, practicing needlepoint, or working with wood. ? Mix up your normal routine. ? Take a short exercise break. Go for a quick walk or run up and down stairs. ? Spend time in public places where smoking is not allowed.  Focus on doing something kind or helpful for someone else.  Call a friend or family member to talk during a craving.  Join a support group.  Call a quit line, such as 1-800-QUIT-NOW.  Talk with your health care provider about medicines that might help you cope with cravings and make quitting easier for you. How can I deal with withdrawal symptoms? Your body may experience negative effects as it tries to get used to not having nicotine in the system. These effects are called withdrawal symptoms. They may include:  Feeling hungrier than normal.  Trouble concentrating.  Irritability.  Trouble sleeping.  Feeling depressed.  Restlessness and agitation.  Craving a cigarette. To manage withdrawal symptoms:  Avoid places, people, and activities that trigger your cravings.  Remember why you want to quit.  Get plenty of sleep.  Avoid coffee and other caffeinated drinks. These may worsen some of your  symptoms. How can I handle social situations? Social situations can be difficult when you are quitting smoking, especially in the first few weeks. To manage this, you can:  Avoid parties, bars, and other social situations where people might be smoking.  Avoid alcohol.  Leave right away if you have the urge to smoke.  Explain to your family and friends that you are quitting smoking. Ask for understanding and support.  Plan activities with friends or family where smoking is not an option. What are some ways I can cope with stress? Wanting to smoke may cause stress, and stress can make you want to smoke. Find ways to manage your stress. Relaxation techniques can help. For example:  Breathe slowly and deeply, in through your nose and out through your mouth.  Listen to soothing, relaxing music.  Talk with a family member or friend about your stress.  Light a candle.  Soak in a bath or take a shower.  Think about a peaceful place. What are some ways I can prevent weight gain? Be aware that many people gain weight after they quit smoking. However, not everyone does. To keep from gaining weight, have a plan in place before you quit and stick to the plan after you quit. Your plan should include:  Having healthy snacks. When you have a craving, it may help to: ? Eat plain popcorn, crunchy carrots, celery, or other cut vegetables. ? Chew sugar-free gum.  Changing how you eat: ? Eat small portion sizes at meals. ? Eat 4-6 small meals   throughout the day instead of 1-2 large meals a day. ? Be mindful when you eat. Do not watch television or do other things that might distract you as you eat.  Exercising regularly: ? Make time to exercise each day. If you do not have time for a long workout, do short bouts of exercise for 5-10 minutes several times a day. ? Do some form of strengthening exercise, like weight lifting, and some form of aerobic exercise, like running or swimming.  Drinking  plenty of water or other low-calorie or no-calorie drinks. Drink 6-8 glasses of water daily, or as much as instructed by your health care provider. Summary  Quitting smoking is a physical and mental challenge. You will face cravings, withdrawal symptoms, and temptation to smoke again. Preparation can help you as you go through these challenges.  You can cope with cravings by keeping your mouth busy (such as by chewing gum), keeping your body and hands busy, and making calls to family, friends, or a helpline for people who want to quit smoking.  You can cope with withdrawal symptoms by avoiding places where people smoke, avoiding drinks with caffeine, and getting plenty of rest.  Ask your health care provider about the different ways to prevent weight gain, avoid stress, and handle social situations. This information is not intended to replace advice given to you by your health care provider. Make sure you discuss any questions you have with your health care provider. Document Revised: 11/18/2017 Document Reviewed: 12/03/2016 Elsevier Patient Education  2020 [employer] Inc[employer]  Earwax Buildup, Adult The ears produce a substance called earwax that helps keep bacteria out of the ear and protects the skin in the ear canal. Occasionally, earwax can build up in the ear and cause discomfort or hearing loss. What increases the risk? This condition is more likely to develop in people who:  Are male.  Are elderly.  Naturally produce more earwax.  Clean their ears often with cotton swabs.  Use earplugs often.  Use in-ear headphones often.  Wear hearing aids.  Have narrow ear canals.  Have earwax that is overly thick or sticky.  Have eczema.  Are dehydrated.  Have excess hair in the ear canal. What are the signs or symptoms? Symptoms of this condition include:  Reduced or muffled hearing.  A feeling of fullness in the ear or feeling that the ear is plugged.  Fluid coming from the  ear.  Ear pain.  Ear itch.  Ringing in the ear.  Coughing.  An obvious piece of earwax that can be seen inside the ear canal. How is this diagnosed? This condition may be diagnosed based on:  Your symptoms.  Your medical history.  An ear exam. During the exam, your health care provider will look into your ear with an instrument called an otoscope. You may have tests, including a hearing test. How is this treated? This condition may be treated by:  Using ear drops to soften the earwax.  Having the earwax removed by a health care provider. The health care provider may: ? Flush the ear with water. ? Use an instrument that has a loop on the end (curette). ? Use a suction device.  Surgery to remove the wax buildup. This may be done in severe cases. Follow these instructions at home:   Take over-the-counter and prescription medicines only as told by your health care provider.  Do not put any objects, including cotton swabs, into your ear. You can clean the opening of  your ear canal with a washcloth or facial tissue.  Follow instructions from your health care provider about cleaning your ears. Do not over-clean your ears.  Drink enough fluid to keep your urine clear or pale yellow. This will help to thin the earwax.  Keep all follow-up visits as told by your health care provider. If earwax builds up in your ears often or if you use hearing aids, consider seeing your health care provider for routine, preventive ear cleanings. Ask your health care provider how often you should schedule your cleanings.  If you have hearing aids, clean them according to instructions from the manufacturer and your health care provider. Contact a health care provider if:  You have ear pain.  You develop a fever.  You have blood, pus, or other fluid coming from your ear.  You have hearing loss.  You have ringing in your ears that does not go away.  Your symptoms do not improve with  treatment.  You feel like the room is spinning (vertigo). Summary  Earwax can build up in the ear and cause discomfort or hearing loss.  The most common symptoms of this condition include reduced or muffled hearing and a feeling of fullness in the ear or feeling that the ear is plugged.  This condition may be diagnosed based on your symptoms, your medical history, and an ear exam.  This condition may be treated by using ear drops to soften the earwax or by having the earwax removed by a health care provider.  Do not put any objects, including cotton swabs, into your ear. You can clean the opening of your ear canal with a washcloth or facial tissue. This information is not intended to replace advice given to you by your health care provider. Make sure you discuss any questions you have with your health care provider. Document Revised: 11/18/2017 Document Reviewed: 02/16/2017 Elsevier Patient Education  2020 [employer] Inc[employer]  Nonspecific Chest Pain Chest pain can be caused by many different conditions. Some causes of chest pain can be life-threatening. These will require treatment right away. Serious causes of chest pain include:  Heart attack.  A tear in the body's main blood vessel.  Redness and swelling (inflammation) around your heart.  Blood clot in your lungs. Other causes of chest pain may not be so serious. These include:  Heartburn.  Anxiety or stress.  Damage to bones or muscles in your chest.  Lung infections. Chest pain can feel like:  Pain or discomfort in your chest.  Crushing, pressure, aching, or squeezing pain.  Burning or tingling.  Dull or sharp pain that is worse when you move, cough, or take a deep breath.  Pain or discomfort that is also felt in your back, neck, jaw, shoulder, or arm, or pain that spreads to any of these areas. It is hard to know whether your pain is caused by something that is serious or something that is not so serious. So it is  important to see your doctor right away if you have chest pain. Follow these instructions at home: Medicines  Take over-the-counter and prescription medicines only as told by your doctor.  If you were prescribed an antibiotic medicine, take it as told by your doctor. Do not stop taking the antibiotic even if you start to feel better. Lifestyle   Rest as told by your doctor.  Do not use any products that contain nicotine or tobacco, such as cigarettes, e-cigarettes, and chewing tobacco. If you need help quitting,  ask your doctor.  Do not drink alcohol.  Make lifestyle changes as told by your doctor. These may include: ? Getting regular exercise. Ask your doctor what activities are safe for you. ? Eating a heart-healthy diet. A diet and nutrition specialist (dietitian) can help you to learn healthy eating options. ? Staying at a healthy weight. ? Treating diabetes or high blood pressure, if needed. ? Lowering your stress. Activities such as yoga and relaxation techniques can help. General instructions  Pay attention to any changes in your symptoms. Tell your doctor about them or any new symptoms.  Avoid any activities that cause chest pain.  Keep all follow-up visits as told by your doctor. This is important. You may need more testing if your chest pain does not go away. Contact a doctor if:  Your chest pain does not go away.  You feel depressed.  You have a fever. Get help right away if:  Your chest pain is worse.  You have a cough that gets worse, or you cough up blood.  You have very bad (severe) pain in your belly (abdomen).  You pass out (faint).  You have either of these for no clear reason: ? Sudden chest discomfort. ? Sudden discomfort in your arms, back, neck, or jaw.  You have shortness of breath at any time.  You suddenly start to sweat, or your skin gets clammy.  You feel sick to your stomach (nauseous).  You throw up (vomit).  You suddenly feel  lightheaded or dizzy.  You feel very weak or tired.  Your heart starts to beat fast, or it feels like it is skipping beats. These symptoms may be an emergency. Do not wait to see if the symptoms will go away. Get medical help right away. Call your local emergency services (911 in the [place] Do not drive yourself to the hospital. Summary  Chest pain can be caused by many different conditions. The cause may be serious and need treatment right away. If you have chest pain, see your doctor right away.  Follow your doctor's instructions for taking medicines and making lifestyle changes.  Keep all follow-up visits as told by your doctor. This includes visits for any further testing if your chest pain does not go away.  Be sure to know the signs that show that your condition has become worse. Get help right away if you have these symptoms. This information is not intended to replace advice given to you by your health care provider. Make sure you discuss any questions you have with your health care provider. Document Revised: 06/08/2018 Document Reviewed: 06/08/2018 Elsevier Patient Education  2020 [employer].

## 2020-08-27 NOTE — Progress Notes (Signed)
New Patient Office Visit  Subjective:  Patient ID: Brian Esparza, male    DOB: [birth year]  Age: 39 y.o. MRN: [id number]  CC:  Chief Complaint  Patient presents with  . Follow-up    follow up on chest pains seen at urgent care. Patient states that he still seem to be having chest pains. Last episode of chest pains yesterday.     HPI Brian Esparza presents for follow-up of chest pains that he has been experiencing in his right upper chest area.  This happens mostly at work and seems to be associated with noxious smells that are intermittently present when they are burning fabric.  There is little to no shortness of breath.  He denies nausea vomiting diaphoresis.  History of asthma as a child.  It has not bothered him much as an adult.  He smokes about a half a pack a day.  Tells of occasional nonprogressive generalized headache that comes every couple of months.  He feels as though it has been associated with a head injury from 2017.  It is relieved when he smokes marijuana.  Chart review shows a favorable lipid profile in 2018.  Past Medical History:  Diagnosis Date  . Allergy   . Asthma   . Colitis     Past Surgical History:  Procedure Laterality Date  . ANKLE SURGERY Left     Family History  Problem Relation Age of Onset  . Hypertension Maternal Grandfather   . Stroke Maternal Grandfather   . Hypertension Maternal Uncle   . Stroke Maternal Uncle   . Colon cancer Neg Hx     Social History   Socioeconomic History  . Marital status: Single    Spouse name: Not on file  . Number of children: 0  . Years of education: Not on file  . Highest education level: Not on file  Occupational History  . Not on file  Tobacco Use  . Smoking status: Current Some Day Smoker    Packs/day: 0.50    Years: 20.00    Pack years: 10.00    Types: Cigarettes  . Smokeless tobacco: Never Used  Vaping Use  . Vaping Use: Never used  Substance and Sexual Activity  . Alcohol use: Yes     Comment: occasionally  . Drug use: Yes    Types: Marijuana    Comment: for headaches/pain from job injury  . Sexual activity: Never  Other Topics Concern  . Not on file  Social History Narrative  . Not on file   Social Determinants of Health   Financial Resource Strain:   . Difficulty of Paying Living Expenses: Not on file  Food Insecurity:   . Worried About Programme researcher, broadcasting/film/video in the Last Year: Not on file  . Ran Out of Food in the Last Year: Not on file  Transportation Needs:   . Lack of Transportation (Medical): Not on file  . Lack of Transportation (Non-Medical): Not on file  Physical Activity:   . Days of Exercise per Week: Not on file  . Minutes of Exercise per Session: Not on file  Stress:   . Feeling of Stress : Not on file  Social Connections:   . Frequency of Communication with Friends and Family: Not on file  . Frequency of Social Gatherings with Friends and Family: Not on file  . Attends Religious Services: Not on file  . Active Member of Clubs or Organizations: Not on file  . Attends Banker  Meetings: Not on file  . Marital Status: Not on file  Intimate Partner Violence:   . Fear of Current or Ex-Partner: Not on file  . Emotionally Abused: Not on file  . Physically Abused: Not on file  . Sexually Abused: Not on file    ROS Review of Systems  Constitutional: Negative.  Negative for diaphoresis.  HENT: Negative.   Eyes: Negative for photophobia and visual disturbance.  Respiratory: Negative for cough, choking, chest tightness and wheezing.   Cardiovascular: Positive for chest pain. Negative for leg swelling.  Gastrointestinal: Negative for abdominal pain, diarrhea and vomiting.  Genitourinary: Negative.   Musculoskeletal: Negative.   Neurological: Positive for headaches. Negative for tremors and speech difficulty.  Hematological: Negative.   Psychiatric/Behavioral: Negative.    Depression screen [facility] 2/9 08/27/2020 08/27/2020 09/23/2016    Decreased Interest 0 0 0  Down, Depressed, Hopeless 0 0 0  PHQ - 2 Score 0 0 0  Altered sleeping 0 - -  Tired, decreased energy 0 - -  Change in appetite 0 - -  Feeling bad or failure about yourself  0 - -  Trouble concentrating 0 - -  Moving slowly or fidgety/restless 0 - -  Suicidal thoughts 0 - -  PHQ-9 Score 0 - -  Difficult doing work/chores Not difficult at all - -    Objective:   Today's Vitals: BP 108/70   Pulse 78   Temp (!) 97.1 F (36.2 C) (Tympanic)   Ht 5\' 7"  (1.702 m)   Wt 170 lb 9.6 oz (77.4 kg)   SpO2 96%   BMI 26.72 kg/m   Physical Exam Vitals and nursing note reviewed.  Constitutional:      General: He is not in acute distress.    Appearance: Normal appearance. He is normal weight. He is not ill-appearing, toxic-appearing or diaphoretic.  HENT:     Head: Normocephalic and atraumatic.     Right Ear: Tympanic membrane, ear canal and external ear normal.     Left Ear: Tympanic membrane, ear canal and external ear normal.     Mouth/Throat:     Mouth: Mucous membranes are moist.     Pharynx: Oropharynx is clear. No oropharyngeal exudate or posterior oropharyngeal erythema.  Eyes:     General: No scleral icterus.       Right eye: No discharge.        Left eye: No discharge.     Extraocular Movements: Extraocular movements intact.     Conjunctiva/sclera: Conjunctivae normal.     Pupils: Pupils are equal, round, and reactive to light.  Cardiovascular:     Rate and Rhythm: Normal rate and regular rhythm.  Pulmonary:     Effort: Pulmonary effort is normal.     Breath sounds: Normal breath sounds.  Abdominal:     General: Abdomen is flat. Bowel sounds are normal. There is no distension.     Palpations: Abdomen is soft. There is no mass.     Tenderness: There is no abdominal tenderness. There is no guarding or rebound.     Hernia: No hernia is present.  Musculoskeletal:     Cervical back: No rigidity or tenderness.     Right lower leg: No edema.      Left lower leg: No edema.  Lymphadenopathy:     Cervical: No cervical adenopathy.  Skin:    General: Skin is warm and dry.  Neurological:     Mental Status: He is alert.  Psychiatric:  Mood and Affect: Mood normal.        Behavior: Behavior normal.     Assessment & Plan:   Problem List Items Addressed This Visit      Other   Healthcare maintenance   Relevant Orders   CBC   Comprehensive metabolic panel   Lipid panel   Urinalysis, Routine w reflex microscopic   Nonintractable episodic headache   Tobacco use   Chest pain - Primary   Relevant Orders   EKG 12-Lead (Completed)   DG Chest 2 View   CBC   Comprehensive metabolic panel   Lipid panel    Other Visit Diagnoses    Excessive cerumen in left ear canal       Relevant Medications   carbamide peroxide (DEBROX) 6.5 % OTIC solution   Tobacco abuse          Outpatient Encounter Medications as of 08/27/2020  Medication Sig  . carbamide peroxide (DEBROX) 6.5 % OTIC solution Place 5 drops into the right ear 2 (two) times daily.  Marland Kitchen tiZANidine (ZANAFLEX) 2 MG tablet Take 1 tablet (2 mg total) by mouth every 8 (eight) hours as needed for muscle spasms.  . [DISCONTINUED] albuterol (PROAIR HFA) 108 (90 Base) MCG/ACT inhaler Inhale 2 puffs into the lungs every 6 (six) hours as needed.  . [DISCONTINUED] gabapentin (NEURONTIN) 100 MG capsule Take 1 capsule (100 mg total) by mouth 3 (three) times daily.   No facility-administered encounter medications on file as of 08/27/2020.    Follow-up: Return if symptoms worsen or fail to improve.   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  Encouraged him to stop smoking.  He was given information on coping with quitting smoking.  Given information on nonspecific chest pain.  Information was given on earwax buildup.  Debrox drops to be used.  Will return for dedicated appointment for ear irrigation.  He is to follow-up if his chest pain persists after changing duty  stations or place of work.  Mliss Sax, MD

## 2020-09-04 ENCOUNTER — Other Ambulatory Visit: Payer: Self-pay

## 2020-09-04 ENCOUNTER — Ambulatory Visit (INDEPENDENT_AMBULATORY_CARE_PROVIDER_SITE_OTHER): Payer: No Typology Code available for payment source | Admitting: Family Medicine

## 2020-09-04 ENCOUNTER — Encounter: Payer: Self-pay | Admitting: Family Medicine

## 2020-09-04 VITALS — BP 115/70 | HR 72 | Temp 97.6°F | Ht 67.0 in | Wt 169.4 lb

## 2020-09-04 DIAGNOSIS — H6121 Impacted cerumen, right ear: Secondary | ICD-10-CM | POA: Insufficient documentation

## 2020-09-04 DIAGNOSIS — H6122 Impacted cerumen, left ear: Secondary | ICD-10-CM | POA: Diagnosis not present

## 2020-09-04 NOTE — Progress Notes (Signed)
Established Patient Office Visit  Subjective:  Patient ID: Brian Esparza, male    DOB: 1981/01/04  Age: 39 y.o. MRN: [id number]  CC:  Chief Complaint  Patient presents with  . Follow-up    irrigate ears     HPI Brian Esparza presents for irrigation of his ears.  He has been using some eardrops.  Discussed his normal blood work with him.  He knows his cytology studies are still pending.  Past Medical History:  Diagnosis Date  . Allergy   . Asthma   . Colitis     Past Surgical History:  Procedure Laterality Date  . ANKLE SURGERY Left     Family History  Problem Relation Age of Onset  . Hypertension Maternal Grandfather   . Stroke Maternal Grandfather   . Hypertension Maternal Uncle   . Stroke Maternal Uncle   . Colon cancer Neg Hx     Social History   Socioeconomic History  . Marital status: Single    Spouse name: Not on file  . Number of children: 0  . Years of education: Not on file  . Highest education level: Not on file  Occupational History  . Not on file  Tobacco Use  . Smoking status: Current Some Day Smoker    Packs/day: 0.50    Years: 20.00    Pack years: 10.00    Types: Cigarettes  . Smokeless tobacco: Never Used  Vaping Use  . Vaping Use: Never used  Substance and Sexual Activity  . Alcohol use: Yes    Comment: occasionally  . Drug use: Yes    Types: Marijuana    Comment: for headaches/pain from job injury  . Sexual activity: Never  Other Topics Concern  . Not on file  Social History Narrative  . Not on file   Social Determinants of Health   Financial Resource Strain:   . Difficulty of Paying Living Expenses: Not on file  Food Insecurity:   . Worried About Programme researcher, broadcasting/film/video in the Last Year: Not on file  . Ran Out of Food in the Last Year: Not on file  Transportation Needs:   . Lack of Transportation (Medical): Not on file  . Lack of Transportation (Non-Medical): Not on file  Physical Activity:   . Days of Exercise per Week:  Not on file  . Minutes of Exercise per Session: Not on file  Stress:   . Feeling of Stress : Not on file  Social Connections:   . Frequency of Communication with Friends and Family: Not on file  . Frequency of Social Gatherings with Friends and Family: Not on file  . Attends Religious Services: Not on file  . Active Member of Clubs or Organizations: Not on file  . Attends Banker Meetings: Not on file  . Marital Status: Not on file  Intimate Partner Violence:   . Fear of Current or Ex-Partner: Not on file  . Emotionally Abused: Not on file  . Physically Abused: Not on file  . Sexually Abused: Not on file    Outpatient Medications Prior to Visit  Medication Sig Dispense Refill  . carbamide peroxide (DEBROX) 6.5 % OTIC solution Place 5 drops into the right ear 2 (two) times daily. 15 mL 0  . tiZANidine (ZANAFLEX) 2 MG tablet Take 1 tablet (2 mg total) by mouth every 8 (eight) hours as needed for muscle spasms. (Patient not taking: Reported on 09/04/2020) 15 tablet 0   No facility-administered medications prior  to visit.    Allergies  Allergen Reactions  . Asa [Aspirin] Rash    ROS Review of Systems  HENT: Positive for ear discharge. Negative for ear pain and hearing loss.   Respiratory: Negative.   Cardiovascular: Negative.   Gastrointestinal: Negative.       Objective:    Physical Exam Vitals and nursing note reviewed.  Constitutional:      General: He is not in acute distress.    Appearance: Normal appearance. He is not ill-appearing, toxic-appearing or diaphoretic.  HENT:     Right Ear: External ear normal. There is impacted cerumen.     Left Ear: Tympanic membrane, ear canal and external ear normal.  Skin:    General: Skin is warm and dry.  Neurological:     Mental Status: He is alert and oriented to person, place, and time.  Psychiatric:        Mood and Affect: Mood normal.        Behavior: Behavior normal.    Subjective:    Brian Esparza is  a 39 y.o. male whom I am asked to see for evaluation of discharge in the right ear for the past 6 month. There is a prior history of cerumen impaction. The patient has been using ear drops to loosen wax immediately prior to this visit. The patient denies ear pain.  The patient's history has been marked as reviewed and updated as appropriate.  Review of Systems Pertinent items are noted in HPI.    Objective:    Auditory canal(s) of the right ear are completely obstructed with cerumen.   Cerumen was removed using gentle irrigation. Tympanic membranes are intact following the procedure.  Auditory canals are normal.    Assessment:    Cerumen Impaction without otitis externa.    Plan:    1. Care instructions given. 2. Home treatment: none. 3. Follow-up as needed.   BP 115/70   Pulse 72   Temp 97.6 F (36.4 C) (Tympanic)   Ht 5\' 7"  (1.702 m)   Wt 169 lb 6.4 oz (76.8 kg)   SpO2 97%   BMI 26.53 kg/m  Wt Readings from Last 3 Encounters:  09/04/20 169 lb 6.4 oz (76.8 kg)  08/27/20 170 lb 9.6 oz (77.4 kg)  04/19/17 149 lb 1.9 oz (67.6 kg)     Health Maintenance Due  Topic Date Due  . Hepatitis C Screening  Never done  . HIV Screening  Never done  . TETANUS/TDAP  Never done  . INFLUENZA VACCINE  Never done    There are no preventive care reminders to display for this patient.  Lab Results  Component Value Date   TSH 0.72 02/07/2017   Lab Results  Component Value Date   WBC 7.6 08/27/2020   HGB 13.3 08/27/2020   HCT 39.9 08/27/2020   MCV 90.1 08/27/2020   PLT 221.0 08/27/2020   Lab Results  Component Value Date   NA 139 08/27/2020   K 4.3 08/27/2020   CO2 31 08/27/2020   GLUCOSE 84 08/27/2020   BUN 10 08/27/2020   CREATININE 0.95 08/27/2020   BILITOT 0.6 08/27/2020   ALKPHOS 58 08/27/2020   AST 33 08/27/2020   ALT 38 08/27/2020   PROT 7.2 08/27/2020   ALBUMIN 4.4 08/27/2020   CALCIUM 9.4 08/27/2020   ANIONGAP 9 09/16/2016   GFR 106.34 08/27/2020   Lab  Results  Component Value Date   CHOL 155 08/27/2020   Lab Results  Component Value  Date   HDL 57.70 08/27/2020   Lab Results  Component Value Date   LDLCALC 85 08/27/2020   Lab Results  Component Value Date   TRIG 62.0 08/27/2020   Lab Results  Component Value Date   CHOLHDL 3 08/27/2020   No results found for: HGBA1C    Assessment & Plan:   Problem List Items Addressed This Visit      Nervous and Auditory   Excessive cerumen in left ear canal - Primary      No orders of the defined types were placed in this encounter.   Follow-up: Return if symptoms worsen or fail to improve.   Advised to use ear softening wax and avoid Q-tip usage.  He was given an information sheet on earwax buildup at his last visit and does not need another one, he confirms. Mliss Sax, MD

## 2021-07-27 ENCOUNTER — Other Ambulatory Visit: Payer: Self-pay

## 2021-07-27 ENCOUNTER — Ambulatory Visit
Admission: EM | Admit: 2021-07-27 | Discharge: 2021-07-27 | Disposition: A | Discharge status: Home / Self Care | Payer: No Typology Code available for payment source

## 2021-07-27 DIAGNOSIS — L98491 Non-pressure chronic ulcer of skin of other sites limited to breakdown of skin: Secondary | ICD-10-CM

## 2021-07-27 NOTE — ED Triage Notes (Signed)
Pt c/o callus to left 4th toe that is causing pain.

## 2021-07-27 NOTE — ED Provider Notes (Signed)
[facility]    CSN: [id number] Arrival date & time: 07/27/21  1146      History   Chief Complaint Chief Complaint  Patient presents with   Toe Pain    HPI Franke [place] is a 40 y.o. male.  Patient complains of callus to left fourth toe.  He has been using a salicylic acid prep that seems to be beginning to remove the callus area.  He wears steel toed shoes at work which increase his discomfort.  HPI  Past Medical History:  Diagnosis Date   Allergy    Asthma    Colitis     Patient Active Problem List   Diagnosis Date Noted   Excessive cerumen in left ear canal 09/04/2020   Tobacco use 08/27/2020   Chest pain 08/27/2020   Healthcare maintenance 12/28/2016   Nonintractable episodic headache 12/28/2016   Neck pain 12/28/2016    Past Surgical History:  Procedure Laterality Date   ANKLE SURGERY Left        Home Medications    Prior to Admission medications   Medication Sig Start Date End Date Taking? Authorizing Provider  carbamide peroxide (DEBROX) 6.5 % OTIC solution Place 5 drops into the right ear 2 (two) times daily. 08/27/20   Mliss Sax, MD  tiZANidine (ZANAFLEX) 2 MG tablet Take 1 tablet (2 mg total) by mouth every 8 (eight) hours as needed for muscle spasms. Patient not taking: Reported on 09/04/2020 07/25/20   Belinda Fisher, PA-C  albuterol [facility] HFA) 108 (90 Base) MCG/ACT inhaler Inhale 2 puffs into the lungs every 6 (six) hours as needed. 12/30/15 05/10/20  Elvina Sidle, MD  gabapentin (NEURONTIN) 100 MG capsule Take 1 capsule (100 mg total) by mouth 3 (three) times daily. 03/22/17 05/10/20  Nche, Bonna Gains, NP    Family History Family History  Problem Relation Age of Onset   Hypertension Maternal Grandfather    Stroke Maternal Grandfather    Hypertension Maternal Uncle    Stroke Maternal Uncle    Colon cancer Neg Hx     Social History Social History   Tobacco Use   Smoking status: Some Days    Packs/day: 0.50    Years:  20.00    Pack years: 10.00    Types: Cigarettes   Smokeless tobacco: Never  Vaping Use   Vaping Use: Never used  Substance Use Topics   Alcohol use: Yes    Comment: occasionally   Drug use: Yes    Types: Marijuana    Comment: for headaches/pain from job injury     Allergies   Asa [aspirin]   Review of Systems Review of Systems  Musculoskeletal:  Positive for joint swelling.  Skin:  Positive for wound.       Left fourth toe  All other systems reviewed and are negative.   Physical Exam Triage Vital Signs ED Triage Vitals  Enc Vitals Group     BP 07/27/21 1158 107/68     Pulse Rate 07/27/21 1158 (!) 56     Resp 07/27/21 1158 18     Temp 07/27/21 1158 98.8 F (37.1 C)     Temp Source 07/27/21 1158 Oral     SpO2 07/27/21 1158 97 %     Weight --      Height --      Head Circumference --      Peak Flow --      Pain Score 07/27/21 1157 8     Pain  Loc --      Pain Edu? --      Excl. in GC? --    No data found.  Updated Vital Signs BP 107/68 (BP Location: Right Arm)   Pulse (!) 56   Temp 98.8 F (37.1 C) (Oral)   Resp 18   SpO2 97%   Visual Acuity Right Eye Distance:   Left Eye Distance:   Bilateral Distance:    Right Eye Near:   Left Eye Near:    Bilateral Near:     Physical Exam Vitals and nursing note reviewed.  Constitutional:      Appearance: Normal appearance.  Cardiovascular:     Rate and Rhythm: Normal rate.  Pulmonary:     Effort: Pulmonary effort is normal.  Skin:    Comments: Left fourth toe: There is a callus that is partially removed probably as a result of the use of salicylic acid.  We discussed options.  I could potentially debride the area but I have recommended continued use of the salicylic acid  Neurological:     Mental Status: He is alert.     UC Treatments / Results  Labs (all labs ordered are listed, but only abnormal results are displayed) Labs Reviewed - No data to display  EKG   Radiology No results  found.  Procedures Procedures (including critical care time)  Medications Ordered in UC Medications - No data to display  Initial Impression / Assessment and Plan / UC Course  I have reviewed the triage vital signs and the nursing notes.  Pertinent labs & imaging results that were available during my care of the patient were reviewed by me and considered in my medical decision making (see chart for details).     Callus left fourth toe.  Continue salicylic acid and wear a pad between third and fourth toes for comfort Final Clinical Impressions(s) / UC Diagnoses   Final diagnoses:  Callous ulcer, limited to breakdown of skin (HCC)     Discharge Instructions      Continue to use acid solution to remove callus Use protective pad when not wearing acid solution   ED Prescriptions   None    PDMP not reviewed this encounter.   Frederica Kuster, MD 07/27/21 1254

## 2021-07-27 NOTE — Discharge Instructions (Addendum)
Continue to use acid solution to remove callus Use protective pad when not wearing acid solution

## 2021-11-02 ENCOUNTER — Emergency Department (HOSPITAL_COMMUNITY): Payer: No Typology Code available for payment source

## 2021-11-02 ENCOUNTER — Emergency Department (HOSPITAL_COMMUNITY)
Admission: EM | Admit: 2021-11-02 | Discharge: 2021-11-02 | Discharge status: Against Medical Advice | Payer: No Typology Code available for payment source | Attending: Emergency Medicine | Admitting: Emergency Medicine

## 2021-11-02 ENCOUNTER — Ambulatory Visit (HOSPITAL_COMMUNITY)
Admission: RE | Admit: 2021-11-02 | Discharge: 2021-11-02 | Disposition: A | Discharge status: Home / Self Care | Payer: No Typology Code available for payment source | Source: Ambulatory Visit | Attending: Emergency Medicine | Admitting: Emergency Medicine

## 2021-11-02 DIAGNOSIS — Z5321 Procedure and treatment not carried out due to patient leaving prior to being seen by health care provider: Secondary | ICD-10-CM | POA: Insufficient documentation

## 2021-11-02 DIAGNOSIS — M79672 Pain in left foot: Secondary | ICD-10-CM | POA: Diagnosis present

## 2021-11-02 DIAGNOSIS — J069 Acute upper respiratory infection, unspecified: Secondary | ICD-10-CM | POA: Insufficient documentation

## 2021-11-02 NOTE — ED Triage Notes (Addendum)
Pt here POV with c/o of left toe pain. History of bone spurs. Ambulatory with steady gait.

## 2021-11-02 NOTE — ED Notes (Signed)
Called patient to sign patient didn't answer

## 2021-11-02 NOTE — ED Provider Notes (Signed)
Emergency Medicine Provider Triage Evaluation Note  Brian Esparza , a 40 y.o. male  was evaluated in triage.  Pt complains of left foot pain x several weeks. Pt states his pain is worse on his left 3rd toe. He previously had left 4th toe pain and was told he had a bone spur and it was removed surgically. Today his pain is made worse with weight bearing and wearing tight shoes. He has not taken anything for pain. No injury noted.   Review of Systems  Positive: Left 4th toe pain Negative: Redness, swelling, fevers, numbness  Physical Exam  BP 112/78   Pulse 64   Temp 97.8 F (36.6 C) (Oral)   Resp 15   SpO2 100%  Gen:   Awake, no distress   Resp:  Normal effort  MSK:   Moves extremities without difficulty  Other:  TTP over left 4th toe  Medical Decision Making  Medically screening exam initiated at 3:13 PM.  Appropriate orders placed.  Brian Esparza was informed that the remainder of the evaluation will be completed by another provider, this initial triage assessment does not replace that evaluation, and the importance of remaining in the ED until their evaluation is complete.     Jeanella Flattery 11/02/21 1514    Milagros Loll, MD 11/03/21 2256

## 2021-12-25 IMAGING — CR DG FOOT COMPLETE 3+V*L*
3 series · 3 of 3 positions shown · non-contrast
Comparison: None.

CLINICAL DATA: Possible bone spur.

EXAM:
LEFT FOOT - COMPLETE 3+ VIEW

[foot ap]
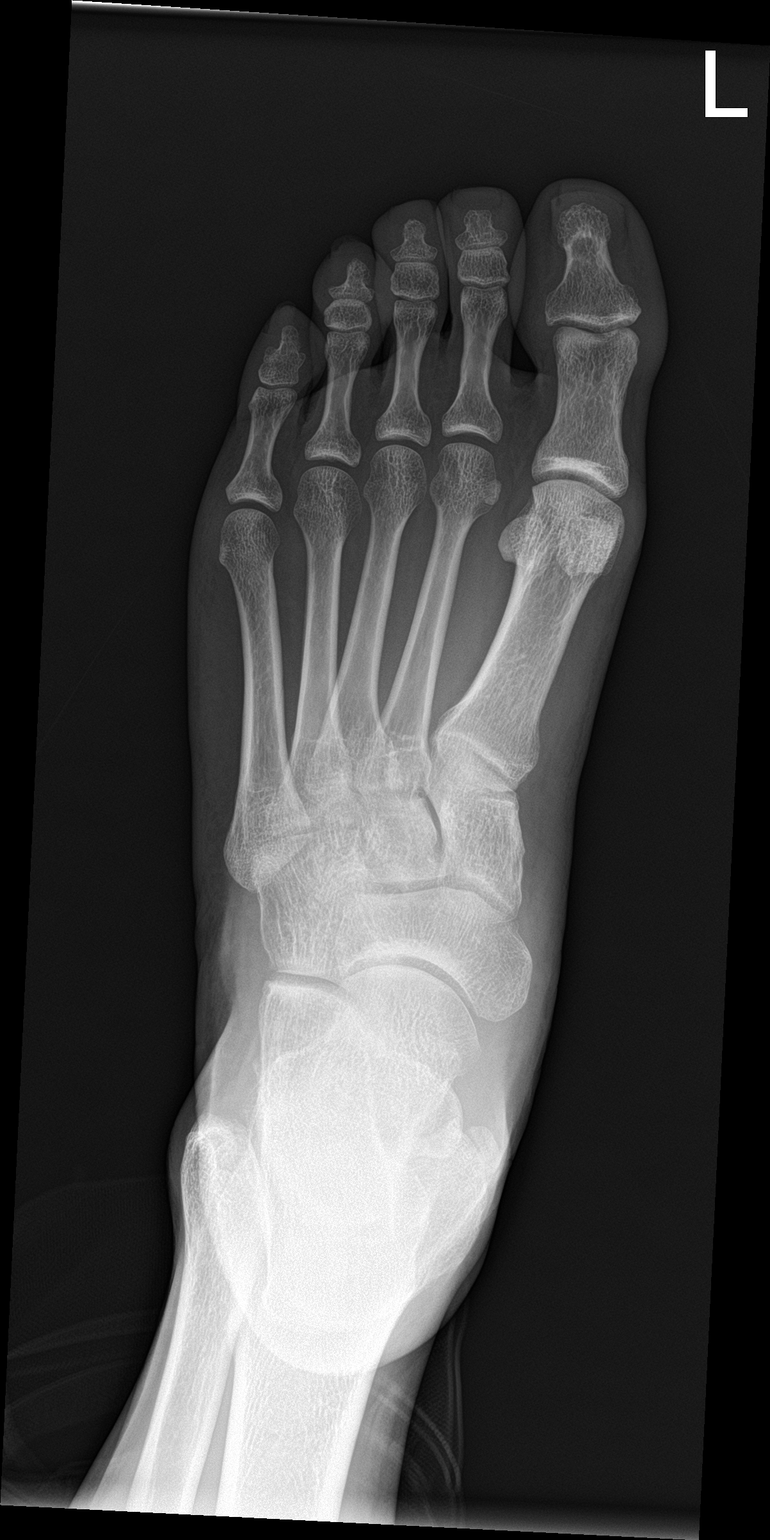

[foot obl]
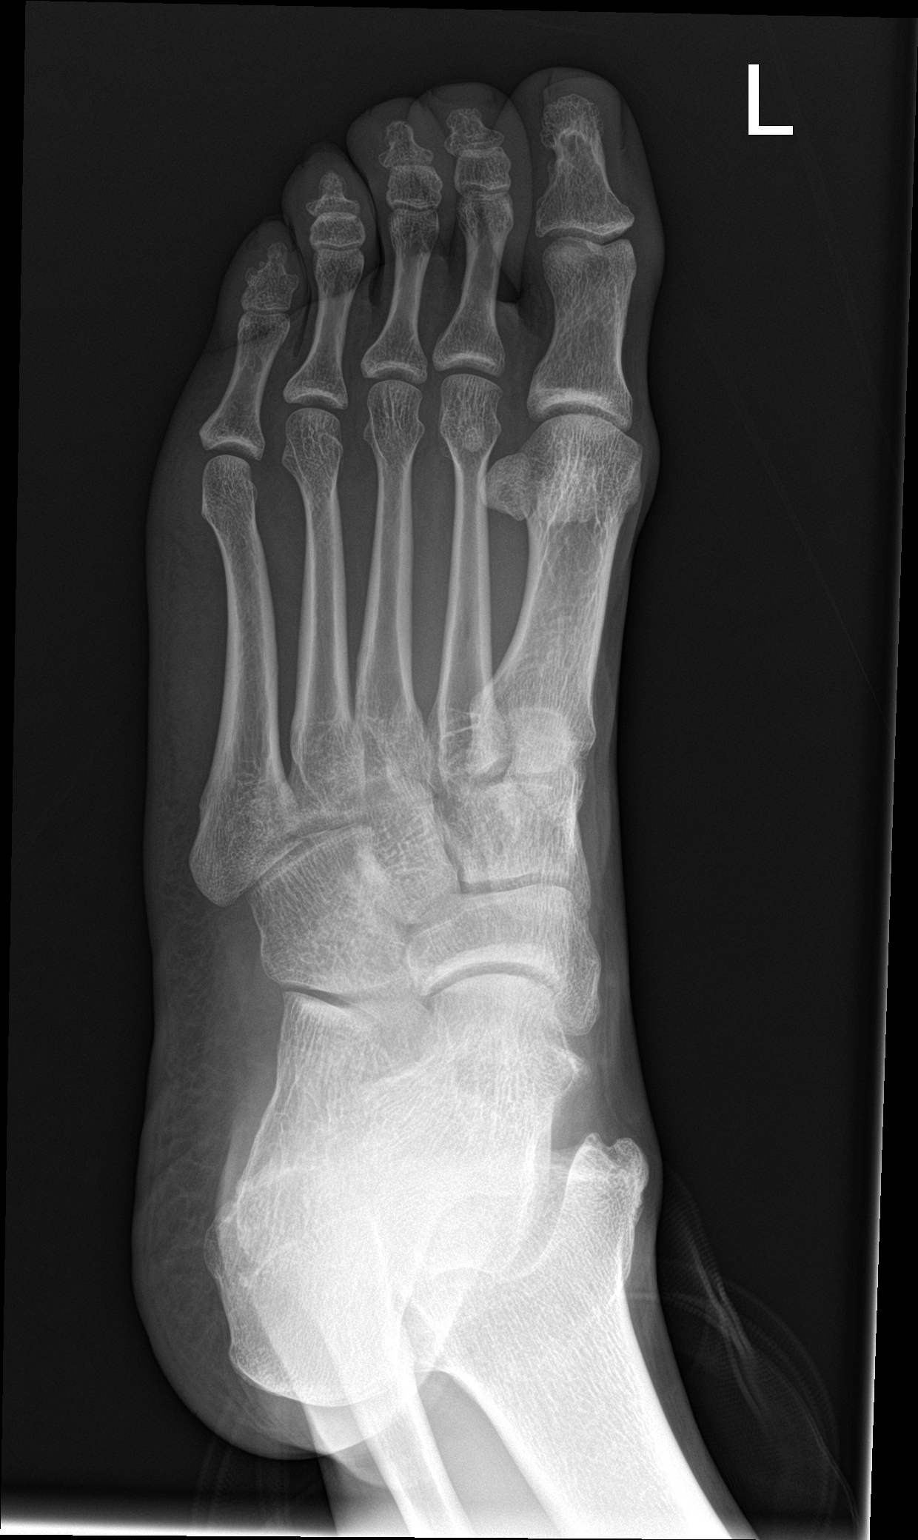

[foot lat]
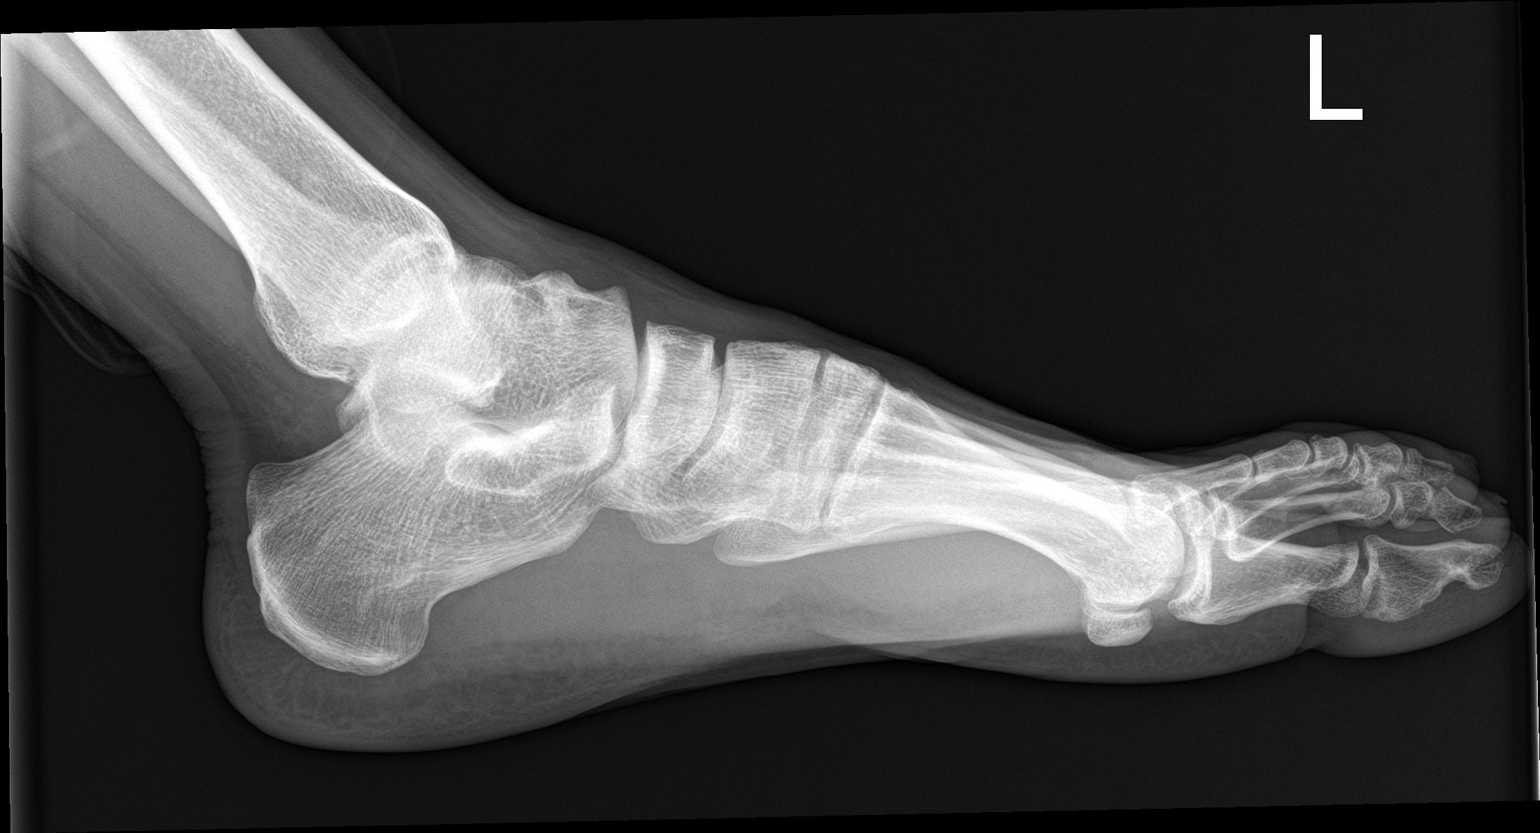

[3 of 3 positions shown; findings below may reference images not displayed]

FINDINGS: There is no evidence of fracture or dislocation. There is no
evidence of arthropathy or other focal bone abnormality. Soft
tissues are unremarkable.
IMPRESSION: Negative.

## 2022-02-22 ENCOUNTER — Ambulatory Visit (INDEPENDENT_AMBULATORY_CARE_PROVIDER_SITE_OTHER): Payer: Managed Care, Other (non HMO) | Admitting: Family Medicine

## 2022-02-22 ENCOUNTER — Encounter: Payer: Self-pay | Admitting: Family Medicine

## 2022-02-22 ENCOUNTER — Other Ambulatory Visit: Payer: Self-pay

## 2022-02-22 VITALS — BP 116/68 | HR 74 | Temp 97.2°F | Ht 67.0 in | Wt 158.4 lb

## 2022-02-22 DIAGNOSIS — R1084 Generalized abdominal pain: Secondary | ICD-10-CM

## 2022-02-22 LAB — BASIC METABOLIC PANEL
BUN: 9 mg/dL (ref 6–23)
CO2: 29 mEq/L (ref 19–32)
Calcium: 9.9 mg/dL (ref 8.4–10.5)
Chloride: 102 mEq/L (ref 96–112)
Creatinine, Ser: 1 mg/dL (ref 0.40–1.50)
GFR: 93.62 mL/min (ref >60.00)
Glucose, Bld: 90 mg/dL (ref 70–99)
Potassium: 3.8 mEq/L (ref 3.5–5.1)
Sodium: 140 mEq/L (ref 135–145)

## 2022-02-22 LAB — URINALYSIS, ROUTINE W REFLEX MICROSCOPIC
Bilirubin Urine: NEGATIVE
Ketones, ur: NEGATIVE
Leukocytes,Ua: NEGATIVE
Nitrite: NEGATIVE
Specific Gravity, Urine: <=1.005 — AB (ref 1.000–1.030)
Total Protein, Urine: NEGATIVE
Urine Glucose: NEGATIVE
Urobilinogen, UA: 0.2 (ref 0.0–1.0)
pH: 6 (ref 5.0–8.0)

## 2022-02-22 LAB — HEPATIC FUNCTION PANEL
ALT: 11 U/L (ref 0–53)
AST: 18 U/L (ref 0–37)
Albumin: 4.8 g/dL (ref 3.5–5.2)
Alkaline Phosphatase: 70 U/L (ref 39–117)
Bilirubin, Direct: 0.1 mg/dL (ref 0.0–0.3)
Total Bilirubin: 0.7 mg/dL (ref 0.2–1.2)
Total Protein: 8 g/dL (ref 6.0–8.3)

## 2022-02-22 LAB — CBC
HCT: 44.3 % (ref 39.0–52.0)
Hemoglobin: 14.9 g/dL (ref 13.0–17.0)
MCHC: 33.7 g/dL (ref 30.0–36.0)
MCV: 88.7 fl (ref 78.0–100.0)
Platelets: 254 10*3/uL (ref 150.0–400.0)
RBC: 5 Mil/uL (ref 4.22–5.81)
RDW: 13.6 % (ref 11.5–15.5)
WBC: 5.7 10*3/uL (ref 4.0–10.5)

## 2022-02-22 LAB — AMYLASE: Amylase: 86 U/L (ref 27–131)

## 2022-02-22 NOTE — Progress Notes (Signed)
? ?Established Patient Office Visit ? ?Subjective:  ?Patient ID: Brian Esparza, male    DOB: 1981-02-09  Age: 41 y.o. MRN: [id number] ? ?CC:  ?Chief Complaint  ?Patient presents with  ? Emesis  ?  Vomiting x 2 days patient states that he had pork over the weekend.   ? ? ?HPI ?Brian Esparza presents for follow-up of an acute episode of nausea and vomiting with some diarrhea that started 2 days ago on Saturday after eating pork.  He was served pork at [employer] when he had asked for [place].  He has no history of allergy to pork.  He has not eaten pork over the last 26 years for spiritual reasons.  He did not develop a rash or difficulty breathing.  His symptoms are currently resolving. ? ?Past Medical History:  ?Diagnosis Date  ? Allergy   ? Asthma   ? Colitis   ? ? ?Past Surgical History:  ?Procedure Laterality Date  ? ANKLE SURGERY Left   ? ? ?Family History  ?Problem Relation Age of Onset  ? Hypertension Maternal Grandfather   ? Stroke Maternal Grandfather   ? Hypertension Maternal Uncle   ? Stroke Maternal Uncle   ? Colon cancer Neg Hx   ? ? ?Social History  ? ?Socioeconomic History  ? Marital status: Single  ?  Spouse name: Not on file  ? Number of children: 0  ? Years of education: Not on file  ? Highest education level: Not on file  ?Occupational History  ? Not on file  ?Tobacco Use  ? Smoking status: Some Days  ?  Packs/day: 0.50  ?  Years: 20.00  ?  Pack years: 10.00  ?  Types: Cigarettes  ? Smokeless tobacco: Never  ?Vaping Use  ? Vaping Use: Never used  ?Substance and Sexual Activity  ? Alcohol use: Yes  ?  Comment: occasionally  ? Drug use: Yes  ?  Types: Marijuana  ?  Comment: for headaches/pain from job injury  ? Sexual activity: Never  ?Other Topics Concern  ? Not on file  ?Social History Narrative  ? Not on file  ? ?Social Determinants of Health  ? ?Financial Resource Strain: Not on file  ?Food Insecurity: Not on file  ?Transportation Needs: Not on file  ?Physical Activity: Not on file  ?Stress: Not  on file  ?Social Connections: Not on file  ?Intimate Partner Violence: Not on file  ? ? ?Outpatient Medications Prior to Visit  ?Medication Sig Dispense Refill  ? carbamide peroxide (DEBROX) 6.5 % OTIC solution Place 5 drops into the right ear 2 (two) times daily. 15 mL 0  ? tiZANidine (ZANAFLEX) 2 MG tablet Take 1 tablet (2 mg total) by mouth every 8 (eight) hours as needed for muscle spasms. (Patient not taking: Reported on 09/04/2020) 15 tablet 0  ? ?No facility-administered medications prior to visit.  ? ? ?Allergies  ?Allergen Reactions  ? Asa [Aspirin] Rash  ? ? ?ROS ?Review of Systems  ?Constitutional:  Negative for diaphoresis, fatigue, fever and unexpected weight change.  ?HENT: Negative.    ?Eyes:  Negative for photophobia and visual disturbance.  ?Respiratory: Negative.    ?Cardiovascular: Negative.   ?Gastrointestinal:  Negative for abdominal pain, diarrhea, nausea and vomiting.  ?Genitourinary: Negative.   ?Musculoskeletal:  Negative for gait problem and joint swelling.  ?Neurological:  Negative for speech difficulty and weakness.  ?Psychiatric/Behavioral: Negative.    ? ?  ?Objective:  ?  ?Physical Exam ?Vitals and nursing  note reviewed.  ?Constitutional:   ?   General: He is not in acute distress. ?   Appearance: Normal appearance. He is not ill-appearing, toxic-appearing or diaphoretic.  ?HENT:  ?   Head: Normocephalic and atraumatic.  ?   Right Ear: External ear normal.  ?   Left Ear: External ear normal.  ?   Mouth/Throat:  ?   Mouth: Mucous membranes are moist.  ?   Pharynx: Oropharynx is clear. No oropharyngeal exudate or posterior oropharyngeal erythema.  ?Eyes:  ?   General: No scleral icterus.    ?   Right eye: No discharge.     ?   Left eye: No discharge.  ?   Extraocular Movements: Extraocular movements intact.  ?   Conjunctiva/sclera: Conjunctivae normal.  ?   Pupils: Pupils are equal, round, and reactive to light.  ?Cardiovascular:  ?   Rate and Rhythm: Normal rate and regular rhythm.   ?Pulmonary:  ?   Effort: Pulmonary effort is normal.  ?   Breath sounds: Normal breath sounds.  ?Abdominal:  ?   General: Abdomen is flat. Bowel sounds are normal. There is no distension.  ?   Palpations: Abdomen is soft.  ?   Tenderness: There is generalized abdominal tenderness. There is no right CVA tenderness, left CVA tenderness, guarding or rebound.  ?   Hernia: There is no hernia in the left inguinal area or right inguinal area.  ?   Comments: Mild tenderness to palpation.  ?Genitourinary: ?   Penis: Circumcised. No hypospadias, erythema, tenderness, discharge, swelling or lesions.   ?   Testes:     ?   Right: Mass, tenderness or swelling not present. Right testis is descended.     ?   Left: Mass, tenderness or swelling not present. Left testis is descended.  ?   Epididymis:  ?   Right: Not inflamed or enlarged.  ?   Left: Not inflamed or enlarged.  ?Musculoskeletal:  ?   Cervical back: No rigidity or tenderness.  ?Lymphadenopathy:  ?   Cervical: No cervical adenopathy.  ?   Lower Body: No right inguinal adenopathy. No left inguinal adenopathy.  ?Skin: ?   General: Skin is warm and dry.  ?Neurological:  ?   Mental Status: He is alert and oriented to person, place, and time.  ?Psychiatric:     ?   Mood and Affect: Mood normal.     ?   Behavior: Behavior normal.  ? ? ?BP 116/68 (BP Location: Right Arm, Patient Position: Sitting, Cuff Size: Normal)   Pulse 74   Temp (!) 97.2 ?F (36.2 ?C) (Temporal)   Ht 5\' 7"  (1.702 m)   Wt 158 lb 6.4 oz (71.8 kg)   SpO2 97%   BMI 24.81 kg/m?  ?Wt Readings from Last 3 Encounters:  ?02/22/22 158 lb 6.4 oz (71.8 kg)  ?09/04/20 169 lb 6.4 oz (76.8 kg)  ?08/27/20 170 lb 9.6 oz (77.4 kg)  ? ? ? ?Health Maintenance Due  ?Topic Date Due  ? HIV Screening  Never done  ? Hepatitis C Screening  Never done  ? ? ?There are no preventive care reminders to display for this patient. ? ?Lab Results  ?Component Value Date  ? TSH 0.72 02/07/2017  ? ?Lab Results  ?Component Value Date  ? WBC  7.6 08/27/2020  ? HGB 13.3 08/27/2020  ? HCT 39.9 08/27/2020  ? MCV 90.1 08/27/2020  ? PLT 221.0 08/27/2020  ? ?Lab Results  ?  Component Value Date  ? NA 139 08/27/2020  ? K 4.3 08/27/2020  ? CO2 31 08/27/2020  ? GLUCOSE 84 08/27/2020  ? BUN 10 08/27/2020  ? CREATININE 0.95 08/27/2020  ? BILITOT 0.6 08/27/2020  ? ALKPHOS 58 08/27/2020  ? AST 33 08/27/2020  ? ALT 38 08/27/2020  ? PROT 7.2 08/27/2020  ? ALBUMIN 4.4 08/27/2020  ? CALCIUM 9.4 08/27/2020  ? ANIONGAP 9 09/16/2016  ? GFR 106.34 08/27/2020  ? ?Lab Results  ?Component Value Date  ? CHOL 155 08/27/2020  ? ?Lab Results  ?Component Value Date  ? HDL 57.70 08/27/2020  ? ?Lab Results  ?Component Value Date  ? LDLCALC 85 08/27/2020  ? ?Lab Results  ?Component Value Date  ? TRIG 62.0 08/27/2020  ? ?Lab Results  ?Component Value Date  ? CHOLHDL 3 08/27/2020  ? ?No results found for: HGBA1C ? ?  ?Assessment & Plan:  ? ?Problem List Items Addressed This Visit   ?None ?Visit Diagnoses   ? ? Generalized abdominal pain    -  Primary  ? Relevant Orders  ? Basic metabolic panel  ? Hepatic function panel  ? Urinalysis, Routine w reflex microscopic  ? CBC  ? Amylase  ? ?  ? ? ?No orders of the defined types were placed in this encounter. ? ? ?Follow-up: Return in about 4 weeks (around 03/22/2022), or if symptoms worsen or fail to improve, for Return fasting for physical exam..  Information was given on abdominal pain.  Could consider allergist referral. ? ? ?Mliss Sax, MD ?

## 2022-03-01 ENCOUNTER — Other Ambulatory Visit: Payer: Self-pay

## 2022-03-01 ENCOUNTER — Ambulatory Visit: Payer: Managed Care, Other (non HMO) | Admitting: Podiatry

## 2022-03-01 ENCOUNTER — Encounter: Payer: Self-pay | Admitting: Podiatry

## 2022-03-01 ENCOUNTER — Ambulatory Visit (INDEPENDENT_AMBULATORY_CARE_PROVIDER_SITE_OTHER): Payer: Managed Care, Other (non HMO)

## 2022-03-01 DIAGNOSIS — M2042 Other hammer toe(s) (acquired), left foot: Secondary | ICD-10-CM | POA: Diagnosis not present

## 2022-03-01 DIAGNOSIS — M79672 Pain in left foot: Secondary | ICD-10-CM

## 2022-03-01 NOTE — Progress Notes (Signed)
Subjective:  ? ?Patient ID: Brian Esparza, male   DOB: 41 y.o.   MRN: [id number]  ? ?HPI ?Patient presents stating he has had a severely painful chronic corn formation between third and fourth toes left foot that was treated in the past by another physician which only temporarily made it better and it is now back to where it was and the other doctor said he needed surgery and he wants to have something done in order to solve his problem patient does smoke occasionally and averages about 1/4 pack/day and does like to be active.  This has been present for about 8 months to 1 year ? ? ?Review of Systems  ?All other systems reviewed and are negative. ? ? ?   ?Objective:  ?Physical Exam ?Vitals and nursing note reviewed.  ?Constitutional:   ?   Appearance: He is well-developed.  ?Pulmonary:  ?   Effort: Pulmonary effort is normal.  ?Musculoskeletal:     ?   General: Normal range of motion.  ?Skin: ?   General: Skin is warm.  ?Neurological:  ?   Mental Status: He is alert.  ?  ?Neurovascular status found to be intact muscle strength was found to be adequate range of motion within normal limits.  Patient does have severe lesion formation between third and fourth toe left foot with keratotic tissue formation that is painful when pressed and make shoe gear difficult with chronic nature to condition with mild changes between adjacent digits but not the discomfort between these 2 toes.  He has trimmed it padded and uses wider shoes without relief of symptoms ? ?   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 ?   ?Plan:  ?H&P x-ray reviewed condition discussed at great length.  I discussed distal arthroplasty digit for left proximal out of the place digit 3 left with possible removal of small amount of other bone in order to reduce the symptoms present and give him relief.  He wants this done and I allowed him to read then signed consent form  understanding alternative treatments complications.  Patient is scheduled for outpatient surgery is encouraged to call with questions concerns prior to surgery and understands total recovery from this can take 3 to 6 months.  All questions concerning surgery and recovery answered ? ?X-rays indicate there is rotational component of the fourth toe bilateral with pressure between the third and fourth digits with spur formation ?   ? ? ?

## 2022-03-02 ENCOUNTER — Telehealth: Payer: Self-pay | Admitting: Urology

## 2022-03-02 NOTE — Telephone Encounter (Signed)
DOS - 03/16/22 ? ?HAMMERTOE REPAIR 3,4 LEFT --- 95284 ?EXC NAIL PERM 1ST LEFT --- 11750 ? ? ?[employer] EFFECTIVE DATE - 01/20/22 ? ?DEDUCTIBLE -  ?OUT OF POCKET - $9,100.00 W/ $9,040.59 REMAINING ?COINSURANCE - 50% ?COPAY - $0.00 ? ? ?PER [employer]'S AUTOMATIVE SYSTEM FOR CPT CODES 13244 AND 11750 NO PRIOR AUTH IS REQUIRED. ? ?REF # [id number] ?REF # [id number] ?REF # [id number] ? ? ?

## 2022-03-05 ENCOUNTER — Other Ambulatory Visit: Payer: Self-pay | Admitting: Podiatry

## 2022-03-05 DIAGNOSIS — M2042 Other hammer toe(s) (acquired), left foot: Secondary | ICD-10-CM

## 2022-03-12 ENCOUNTER — Ambulatory Visit (INDEPENDENT_AMBULATORY_CARE_PROVIDER_SITE_OTHER): Payer: Managed Care, Other (non HMO) | Admitting: Family Medicine

## 2022-03-12 ENCOUNTER — Encounter: Payer: Self-pay | Admitting: Family Medicine

## 2022-03-12 VITALS — BP 102/68 | HR 68 | Temp 97.0°F | Ht 67.0 in | Wt 159.2 lb

## 2022-03-12 DIAGNOSIS — Z0000 Encounter for general adult medical examination without abnormal findings: Secondary | ICD-10-CM | POA: Diagnosis not present

## 2022-03-12 DIAGNOSIS — Z1159 Encounter for screening for other viral diseases: Secondary | ICD-10-CM | POA: Diagnosis not present

## 2022-03-12 DIAGNOSIS — H6121 Impacted cerumen, right ear: Secondary | ICD-10-CM | POA: Diagnosis not present

## 2022-03-12 DIAGNOSIS — Z114 Encounter for screening for human immunodeficiency virus [HIV]: Secondary | ICD-10-CM

## 2022-03-12 DIAGNOSIS — Z91014 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: Secondary | ICD-10-CM | POA: Insufficient documentation

## 2022-03-12 LAB — COMPREHENSIVE METABOLIC PANEL
ALT: 10 U/L (ref 0–53)
AST: 14 U/L (ref 0–37)
Albumin: 4.6 g/dL (ref 3.5–5.2)
Alkaline Phosphatase: 78 U/L (ref 39–117)
BUN: 9 mg/dL (ref 6–23)
CO2: 29 mEq/L (ref 19–32)
Calcium: 9.4 mg/dL (ref 8.4–10.5)
Chloride: 103 mEq/L (ref 96–112)
Creatinine, Ser: 0.92 mg/dL (ref 0.40–1.50)
GFR: 103.43 mL/min (ref >60.00)
Glucose, Bld: 82 mg/dL (ref 70–99)
Potassium: 4.1 mEq/L (ref 3.5–5.1)
Sodium: 140 mEq/L (ref 135–145)
Total Bilirubin: 0.7 mg/dL (ref 0.2–1.2)
Total Protein: 7.3 g/dL (ref 6.0–8.3)

## 2022-03-12 LAB — LIPID PANEL
Cholesterol: 150 mg/dL (ref 0–200)
HDL: 43.7 mg/dL (ref >39.00)
LDL Cholesterol: 95 mg/dL (ref 0–99)
NonHDL: 106.22
Total CHOL/HDL Ratio: 3
Triglycerides: 56 mg/dL (ref 0.0–149.0)
VLDL: 11.2 mg/dL (ref 0.0–40.0)

## 2022-03-12 NOTE — Progress Notes (Signed)
? ?Established Patient Office Visit ? ?Subjective:  ?Patient ID: Brian Esparza, male    DOB: 13-Jul-1981  Age: 41 y.o. MRN: [id number] ? ?CC:  ?Chief Complaint  ?Patient presents with  ? Annual Exam  ?  CPE, no concerns. Patient fasting  ? ? ?HPI ?Brian Esparza presents for follow-up of abdominal pain physical and a discussion of a possible allergy to mammalian meats.  He is okay with chicken fish and [place].  He avoids red meat and pork.  Abdominal pain is resolved.  Right ear feels clogged.  He would like it irrigated. ? ?Past Medical History:  ?Diagnosis Date  ? Allergy   ? Asthma   ? Colitis   ? ? ?Past Surgical History:  ?Procedure Laterality Date  ? ANKLE SURGERY Left   ? ? ?Family History  ?Problem Relation Age of Onset  ? Hypertension Maternal Grandfather   ? Stroke Maternal Grandfather   ? Hypertension Maternal Uncle   ? Stroke Maternal Uncle   ? Colon cancer Neg Hx   ? ? ?Social History  ? ?Socioeconomic History  ? Marital status: Single  ?  Spouse name: Not on file  ? Number of children: 0  ? Years of education: Not on file  ? Highest education level: Not on file  ?Occupational History  ? Not on file  ?Tobacco Use  ? Smoking status: Some Days  ?  Packs/day: 0.50  ?  Years: 20.00  ?  Pack years: 10.00  ?  Types: Cigarettes  ? Smokeless tobacco: Never  ?Vaping Use  ? Vaping Use: Never used  ?Substance and Sexual Activity  ? Alcohol use: Yes  ?  Comment: occasionally  ? Drug use: Yes  ?  Types: Marijuana  ?  Comment: for headaches/pain from job injury  ? Sexual activity: Yes  ?Other Topics Concern  ? Not on file  ?Social History Narrative  ? Not on file  ? ?Social Determinants of Health  ? ?Financial Resource Strain: Not on file  ?Food Insecurity: Not on file  ?Transportation Needs: Not on file  ?Physical Activity: Not on file  ?Stress: Not on file  ?Social Connections: Not on file  ?Intimate Partner Violence: Not on file  ? ? ?Outpatient Medications Prior to Visit  ?Medication Sig Dispense Refill  ?  carbamide peroxide (DEBROX) 6.5 % OTIC solution Place 5 drops into the right ear 2 (two) times daily. (Patient not taking: Reported on 03/12/2022) 15 mL 0  ? tiZANidine (ZANAFLEX) 2 MG tablet Take 1 tablet (2 mg total) by mouth every 8 (eight) hours as needed for muscle spasms. (Patient not taking: Reported on 09/04/2020) 15 tablet 0  ? ?No facility-administered medications prior to visit.  ? ? ?Allergies  ?Allergen Reactions  ? Asa [Aspirin] Rash  ? ? ?ROS ?Review of Systems  ?Constitutional:  Negative for chills, diaphoresis, fatigue, fever and unexpected weight change.  ?HENT:  Positive for hearing loss. Negative for ear pain.   ?Eyes:  Negative for photophobia and visual disturbance.  ?Respiratory: Negative.    ?Cardiovascular: Negative.   ?Gastrointestinal: Negative.   ?Endocrine: Negative for polyphagia and polyuria.  ?Genitourinary: Negative.   ?Neurological:  Negative for tremors, speech difficulty, weakness and numbness.  ? ?  ?Objective:  ?  ?Physical Exam ?Vitals and nursing note reviewed.  ?Constitutional:   ?   General: He is not in acute distress. ?   Appearance: Normal appearance. He is not ill-appearing, toxic-appearing or diaphoretic.  ?HENT:  ?  Head: Normocephalic and atraumatic.  ?   Right Ear: There is impacted cerumen.  ?   Left Ear: Tympanic membrane, ear canal and external ear normal.  ?   Mouth/Throat:  ?   Mouth: Mucous membranes are moist.  ?   Pharynx: Oropharynx is clear. No oropharyngeal exudate or posterior oropharyngeal erythema.  ?Eyes:  ?   General: No scleral icterus.    ?   Right eye: No discharge.     ?   Left eye: No discharge.  ?   Extraocular Movements: Extraocular movements intact.  ?   Conjunctiva/sclera: Conjunctivae normal.  ?   Pupils: Pupils are equal, round, and reactive to light.  ?Cardiovascular:  ?   Rate and Rhythm: Normal rate and regular rhythm.  ?Pulmonary:  ?   Effort: Pulmonary effort is normal.  ?   Breath sounds: Normal breath sounds.  ?Abdominal:  ?    General: Abdomen is flat. Bowel sounds are normal. There is no distension.  ?   Palpations: Abdomen is soft. There is no mass.  ?Musculoskeletal:  ?   Cervical back: No rigidity or tenderness.  ?Lymphadenopathy:  ?   Cervical: No cervical adenopathy.  ?Skin: ?   General: Skin is warm and dry.  ?Neurological:  ?   Mental Status: He is alert and oriented to person, place, and time.  ?Psychiatric:     ?   Mood and Affect: Mood normal.     ?   Behavior: Behavior normal.  ? ?Subjective:  ? ? Brian Esparza is a 41 y.o. male whom I am asked to see for evaluation of diminished hearing in the right ear for the past 3 months. There is a prior history of cerumen impaction. The patient has not been using ear drops to loosen wax immediately prior to this visit. The patient denies ear pain. ? ?The patient's history has been marked as reviewed and updated as appropriate. ? ?Review of Systems ?Pertinent items are noted in HPI.  ?  ?Objective:  ? ? Auditory canal(s) of the right ear are partially obstructed with cerumen.  ? ?Cerumen was removed using gentle irrigation. Tympanic membranes are intact following the procedure.  Auditory canals are normal.  ?  ?Assessment:  ? ? Cerumen Impaction without otitis externa.  ?  ?Plan:  ? ? 1. Care instructions given. ?2. Home treatment: none. ?3. Follow-up as needed.  ? ? ?BP 102/68 (BP Location: Right Arm, Patient Position: Sitting, Cuff Size: Normal)   Pulse 68   Temp (!) 97 ?F (36.1 ?C) (Temporal)   Ht 5\' 7"  (1.702 m)   Wt 159 lb 3.2 oz (72.2 kg)   SpO2 97%   BMI 24.93 kg/m?  ?Wt Readings from Last 3 Encounters:  ?03/12/22 159 lb 3.2 oz (72.2 kg)  ?02/22/22 158 lb 6.4 oz (71.8 kg)  ?09/04/20 169 lb 6.4 oz (76.8 kg)  ? ? ? ?Health Maintenance Due  ?Topic Date Due  ? Hepatitis C Screening  Never done  ? ? ?There are no preventive care reminders to display for this patient. ? ?Lab Results  ?Component Value Date  ? TSH 0.72 02/07/2017  ? ?Lab Results  ?Component Value Date  ? WBC 5.7  02/22/2022  ? HGB 14.9 02/22/2022  ? HCT 44.3 02/22/2022  ? MCV 88.7 02/22/2022  ? PLT 254.0 02/22/2022  ? ?Lab Results  ?Component Value Date  ? NA 140 02/22/2022  ? K 3.8 02/22/2022  ? CO2 29 02/22/2022  ? GLUCOSE  90 02/22/2022  ? BUN 9 02/22/2022  ? CREATININE 1.00 02/22/2022  ? BILITOT 0.7 02/22/2022  ? ALKPHOS 70 02/22/2022  ? AST 18 02/22/2022  ? ALT 11 02/22/2022  ? PROT 8.0 02/22/2022  ? ALBUMIN 4.8 02/22/2022  ? CALCIUM 9.9 02/22/2022  ? ANIONGAP 9 09/16/2016  ? GFR 93.62 02/22/2022  ? ?Lab Results  ?Component Value Date  ? CHOL 155 08/27/2020  ? ?Lab Results  ?Component Value Date  ? HDL 57.70 08/27/2020  ? ?Lab Results  ?Component Value Date  ? LDLCALC 85 08/27/2020  ? ?Lab Results  ?Component Value Date  ? TRIG 62.0 08/27/2020  ? ?Lab Results  ?Component Value Date  ? CHOLHDL 3 08/27/2020  ? ?No results found for: HGBA1C ? ?  ?Assessment & Plan:  ? ?Problem List Items Addressed This Visit   ? ?  ? Nervous and Auditory  ? Excessive cerumen in right ear canal  ?  ? Other  ? Encounter for hepatitis C screening test for low risk patient  ? Relevant Orders  ? Hepatitis C antibody  ? 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 Relevant Orders  ? Alpha-Gal Panel  ? ? ?No orders of the defined types were placed in this encounter. ? ? ?Follow-up: Return in about 3 months (around 06/12/2022).  ?Information was given on health maintenance and disease prevention.  Right ear congestion improved with irrigation.  Information was given on allergies to mammalian meats.  Alpha-gal panel ordered.  Screening for hep C and HIV today. ? ?Mliss SaxWilliam Alfred Lynne Takemoto, MD ?

## 2022-03-17 LAB — ALPHA-GAL PANEL
Allergen, Mutton, f88: <0.1 kU/L
Allergen, Pork, f26: 0.18 kU/L — ABNORMAL HIGH
Beef: <0.1 kU/L
CLASS: 0
Class: 0
GALACTOSE-ALPHA-1,3-GALACTOSE IGE*: <0.1 kU/L (ref <0.10)

## 2022-03-17 LAB — HEPATITIS C ANTIBODY
Hepatitis C Ab: NONREACTIVE
SIGNAL TO CUT-OFF: 0.21 (ref <1.00)

## 2022-03-17 LAB — HIV ANTIBODY (ROUTINE TESTING W REFLEX): HIV 1&2 Ab, 4th Generation: NONREACTIVE

## 2022-03-17 LAB — INTERPRETATION:

## 2022-03-22 ENCOUNTER — Encounter: Payer: Managed Care, Other (non HMO) | Admitting: Podiatry

## 2022-03-24 NOTE — Addendum Note (Signed)
Addended by: Lake Bells on: 03/24/2022 11:10 AM ? ? Modules accepted: Orders ? ?

## 2022-03-25 ENCOUNTER — Ambulatory Visit: Payer: Managed Care, Other (non HMO) | Admitting: Family Medicine

## 2022-03-29 MED ORDER — HYDROCODONE-ACETAMINOPHEN 10-325 MG PO TABS: 1.0000 | ORAL_TABLET | Freq: Three times a day (TID) | ORAL | 0 refills | Status: AC | PRN

## 2022-03-29 NOTE — Addendum Note (Signed)
Addended by: Lenn Sink on: 03/29/2022 04:53 PM ? ? Modules accepted: Orders ? ?

## 2022-03-30 ENCOUNTER — Encounter: Payer: Self-pay | Admitting: Podiatry

## 2022-03-30 DIAGNOSIS — L600 Ingrowing nail: Secondary | ICD-10-CM | POA: Diagnosis not present

## 2022-03-30 DIAGNOSIS — M2042 Other hammer toe(s) (acquired), left foot: Secondary | ICD-10-CM | POA: Diagnosis not present

## 2022-04-01 ENCOUNTER — Telehealth: Payer: Self-pay | Admitting: Podiatry

## 2022-04-01 NOTE — Telephone Encounter (Signed)
Patient called again, he has not had any after care instruction?

## 2022-04-01 NOTE — Telephone Encounter (Signed)
Pt called concerned because the [facility] around his toe is brown. The ace bandage is normal and the sock over the ace bandage is fine. Is that normal for the gause to be brown? ?

## 2022-04-01 NOTE — Telephone Encounter (Signed)
Just from the toe. Leave the rest of the dressing on

## 2022-04-01 NOTE — Telephone Encounter (Signed)
Patient is calling and wants to know if he is supposed to remove the bandage from the procedural left great toe after 2 days. Please advise. ?

## 2022-04-02 NOTE — Telephone Encounter (Signed)
Patient has been given instructions per physician,verbalized understanding.

## 2022-04-05 ENCOUNTER — Ambulatory Visit (INDEPENDENT_AMBULATORY_CARE_PROVIDER_SITE_OTHER): Payer: Managed Care, Other (non HMO)

## 2022-04-05 ENCOUNTER — Ambulatory Visit (INDEPENDENT_AMBULATORY_CARE_PROVIDER_SITE_OTHER): Payer: Managed Care, Other (non HMO) | Admitting: Podiatry

## 2022-04-05 ENCOUNTER — Encounter: Payer: Self-pay | Admitting: Podiatry

## 2022-04-05 DIAGNOSIS — M2042 Other hammer toe(s) (acquired), left foot: Secondary | ICD-10-CM | POA: Diagnosis not present

## 2022-04-05 NOTE — Progress Notes (Signed)
Subjective:  ? ?Patient ID: Brian Esparza, male   DOB: 41 y.o.   MRN: [id number]  ? ?HPI ?Patient presents stating doing well with toes with good alignment noted very pleased with how things feel ? ? ?ROS ? ? ?   ?Objective:  ?Physical Exam  ?Neurovascular status intact digits 3 4 left healing well wound edges well coapted toes in good alignment ingrown toenail healing well ? ?   ?Assessment:  ?Doing well post arthroplasty ingrown toenail removal ? ?   ?Plan:  ?Advised on continued elevation and open toed shoe gear usage along with dressings applied today.  Patient will reappoint 2 weeks suture removal earlier if any issues were to occur appears to be healing uneventfully ? ?X-rays indicate satisfactory resection of bone with good positional component third and fourth toes ?   ? ? ?

## 2022-04-13 ENCOUNTER — Telehealth: Payer: Self-pay | Admitting: *Deleted

## 2022-04-13 ENCOUNTER — Other Ambulatory Visit: Payer: Self-pay | Admitting: Podiatry

## 2022-04-13 MED ORDER — ACETAMINOPHEN-CODEINE #3 300-30 MG PO TABS: 1.0000 | ORAL_TABLET | ORAL | 0 refills | Status: DC | PRN

## 2022-04-13 NOTE — Telephone Encounter (Signed)
Patient has been notified

## 2022-04-13 NOTE — Telephone Encounter (Signed)
Patient is calling because his procedural toe(ingrown)was bleeding /little drainage when he got out of the shower this morning, very concerned. Instructed him to soak toe in epsom salts/warm tap water 20 mins twice day until further notice from doctor. ?He is also having some pain from the surgery toe,requesting something for pain but  not as strong as meds given at surgery.  ?

## 2022-04-23 ENCOUNTER — Encounter: Payer: Self-pay | Admitting: Podiatry

## 2022-04-23 ENCOUNTER — Ambulatory Visit (INDEPENDENT_AMBULATORY_CARE_PROVIDER_SITE_OTHER): Payer: Managed Care, Other (non HMO)

## 2022-04-23 DIAGNOSIS — M2042 Other hammer toe(s) (acquired), left foot: Secondary | ICD-10-CM

## 2022-04-23 NOTE — Progress Notes (Signed)
Patient in office for suture removal. Patient denies nausea, vomiting, fever and chills at this time. Sutures removed from left foot without complication. Advised patient to call the office with any questions, comments, or concerns. Patient verbalized understanding. ?

## 2022-05-10 ENCOUNTER — Encounter: Payer: Self-pay | Admitting: Internal Medicine

## 2022-05-10 ENCOUNTER — Ambulatory Visit (INDEPENDENT_AMBULATORY_CARE_PROVIDER_SITE_OTHER): Payer: Commercial Managed Care - HMO | Admitting: Internal Medicine

## 2022-05-10 VITALS — BP 90/60 | HR 65 | Temp 97.7°F | Resp 16 | Ht 67.0 in | Wt 160.8 lb

## 2022-05-10 DIAGNOSIS — T7800XA Anaphylactic reaction due to unspecified food, initial encounter: Secondary | ICD-10-CM

## 2022-05-10 MED ORDER — EPINEPHRINE 0.3 MG/0.3ML IJ SOAJ: 0.3000 mg | INTRAMUSCULAR | 1 refills | Status: DC | PRN

## 2022-05-10 NOTE — Patient Instructions (Signed)
Food allergy:  - previous blood work mildly positive to pork  - Given your desire to continue to avoid all forms of red meat I recommend calling this a pork allergy based on history and no further testing is needed -We can consider repeat testing in the future if he would like to reintroduce red meat - please strictly avoid all forms of red meat  - for SKIN only reaction, okay to take Benadryl 25  mg  every 6 hours - for SKIN + ANY additional symptoms, OR IF concern for LIFE THREATENING reaction = Epipen Autoinjector EpiPen 0.3 mg. - If using Epinephrine autoinjector, call 911 - A food allergy action plan has been provided and discussed. - Medic Alert identification is recommended.   Follow up: 12 months or sooner if you have any additional reactions   Thank you so much for letting me partake in your care today.  Don't hesitate to reach out if you have any additional concerns!  Ferol Luz, MD  [facility] [place], [place]

## 2022-05-10 NOTE — Progress Notes (Signed)
New Patient Note  RE: Brian Esparza MRN: [id number] DOB: June 12, 1981 Date of Office Visit: 05/10/2022  Consult requested by: Mliss Sax,* Primary care provider: Mliss Sax, MD  Chief Complaint: Food Intolerance (alphagal)  History of Present Illness: I had the pleasure of seeing Brian Esparza for initial evaluation at the [facility] on 05/10/2022. He is a 41 y.o. male, who is referred here by Mliss Sax, MD for the evaluation of possible food allergy.  Concern for Food Allergy:  Food of concern: pork  History of reaction: in February he ate [place] sausage croissant from [employer], but was given pork sausage and developed vomiting, diarrhea, throat tightness but no dizziness, coughing or wheezing. He left work and symptoms resolved within 1 week.  Also reports a history of nausea, vomiting with ingestion of [place] bacon cross contaminated.   Previous allergy testing yes: sIgE positive to pork, alpha gal, beef, mutton was negative.  Currently avoids all forms of red meat due to preference.   Eats egg, dairy, wheat, soy, fish, shellfish, peanuts, tree nuts, sesame without reactions Carries an epinephrine autoinjector: no Has food allergy action plan no Denies any symptoms around cats.  Is not interested in reintroducing red meat at this time.   Beef kU/L <0.10  [facility] [place]  CLASS  0  [facility] [place]  Allergen, Mutton, f88 kU/L <0.10  [facility] [place]  Class  0  [facility] [place]  Allergen, Pork, f26 kU/L 0.18 High   [facility] [place]  CLASS  0/1  [facility]   GALACTOSE-ALPHA-1,3-GALACTOSE IGE* <0.10 kU/L <0.10     Assessment and Plan: Brian Esparza is a 41 y.o. male with: Allergy with anaphylaxis due to food Plan: Patient Instructions  Food allergy:  - previous blood work mildly positive to pork  - Given your desire to continue to avoid all  forms of red meat I recommend calling this a pork allergy based on history and no further testing is needed -We can consider repeat testing in the future if he would like to reintroduce red meat - please strictly avoid all forms of red meat  - for SKIN only reaction, okay to take Benadryl 25  mg  every 6 hours - for SKIN + ANY additional symptoms, OR IF concern for LIFE THREATENING reaction = Epipen Autoinjector EpiPen 0.3 mg. - If using Epinephrine autoinjector, call 911 - A food allergy action plan has been provided and discussed. - Medic Alert identification is recommended.   Follow up: 12 months or sooner if you have any additional reactions   Thank you so much for letting me partake in your care today.  Don't hesitate to reach out if you have any additional concerns!  Ferol Luz, MD  [facility] [place] [place]  Return in about 1 year (around 05/11/2023).  Meds ordered this encounter  Medications   EPINEPHrine 0.3 mg/0.3 mL IJ SOAJ injection    Sig: Inject 0.3 mg into the muscle as needed for anaphylaxis.    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 1 each 1   No current facility-administered medications for this visit.   Allergies: Allergies  Allergen Reactions   Asa [Aspirin] Rash   Social History: Social History   Socioeconomic History   Marital status: Single    Spouse name: Not on file   Number of children: 0   Years of education: Not on file   Highest education level: Not on file  Occupational History   Not on file  Tobacco Use   Smoking status: Some Days    Packs/day: 0.50    Years: 20.00    Pack years: 10.00    Types: Cigarettes   Smokeless tobacco: Never  Vaping Use   Vaping Use: Never used  Substance and Sexual Activity   Alcohol use: Yes    Comment: occasionally   Drug use: Yes    Types: Marijuana    Comment: for headaches/pain from job injury   Sexual activity: Yes  Other Topics Concern   Not on file  Social History Narrative   Not on file   Social Determinants of Health   Financial Resource Strain: Not on file  Food Insecurity: Not on file  Transportation Needs: Not on file  Physical Activity: Not on file  Stress: Not on file  Social Connections: Not on file   Lives in a Condo that is 42 years old, there no roaches in the house, bed is 2 ft off the floor, There are dust mite precautions on bed or pillows, he does not have a job was exposed to fumes, chemicals or dust.  [place]. Smoking: 26 pack years Occupation: Therapist, sportsAssistant manager and retail   Environmental History: ImmunologistWater Damage/mildew in the house: yes Carpet in the family room: yes Carpet in the bedroom: yes Heating: electric Cooling: central Pet: no  Family History: Family History  Problem Relation Age of Onset   Hypertension Maternal Grandfather    Stroke Maternal Grandfather    Hypertension Maternal Uncle    Stroke Maternal Uncle    Colon  cancer Neg Hx      ROS: All others negative except as noted per HPI.   Objective: BP 90/60   Pulse 65   Temp 97.7 F (36.5 C) (Temporal)   Resp 16   Ht 5\' 7"  (1.702 m)   Wt 160 lb 12.8 oz (72.9 kg)   SpO2 99%   BMI 25.18 kg/m  Body mass index is 25.18 kg/m.  General Appearance:  Alert, cooperative, no distress, appears stated age  Head:  Normocephalic, without obvious abnormality, atraumatic  Eyes:  Conjunctiva clear, EOM's intact  Nose: Nares normal, normal mucosa, no visible anterior polyps, and septum midline  Throat: Lips, tongue normal; teeth and gums normal, normal posterior oropharynx and no tonsillar exudate  Neck: Supple, symmetrical  Lungs:   clear to auscultation bilaterally, Respirations unlabored, no coughing  Heart:  regular rate and rhythm and no murmur, Appears well perfused  Extremities: No edema  Skin: Skin color, texture, turgor normal, no rashes or lesions on visualized portions of skin  Neurologic: No gross deficits   The plan was reviewed with the patient/family, and all questions/concerned were addressed.  It was my pleasure to see Brian Esparza today and participate in his care. Please feel free to contact me with any questions or concerns.  Sincerely,  Ferol Luz, MD Allergy & Immunology  [facility] office: [phone] [facility] office: [phone]

## 2023-06-24 ENCOUNTER — Encounter (HOSPITAL_BASED_OUTPATIENT_CLINIC_OR_DEPARTMENT_OTHER): Payer: Self-pay

## 2023-06-24 ENCOUNTER — Emergency Department (HOSPITAL_BASED_OUTPATIENT_CLINIC_OR_DEPARTMENT_OTHER)
Admission: EM | Admit: 2023-06-24 | Discharge: 2023-06-24 | Disposition: A | Discharge status: Home / Self Care | Payer: 59 | Attending: Emergency Medicine | Admitting: Emergency Medicine

## 2023-06-24 ENCOUNTER — Other Ambulatory Visit: Payer: Self-pay

## 2023-06-24 ENCOUNTER — Emergency Department (HOSPITAL_BASED_OUTPATIENT_CLINIC_OR_DEPARTMENT_OTHER): Payer: 59

## 2023-06-24 DIAGNOSIS — M25512 Pain in left shoulder: Secondary | ICD-10-CM | POA: Insufficient documentation

## 2023-06-24 DIAGNOSIS — M542 Cervicalgia: Secondary | ICD-10-CM | POA: Diagnosis present

## 2023-06-24 DIAGNOSIS — Y92410 Unspecified street and highway as the place of occurrence of the external cause: Secondary | ICD-10-CM | POA: Insufficient documentation

## 2023-06-24 MED ORDER — METHOCARBAMOL 500 MG PO TABS: 1000.0000 mg | ORAL_TABLET | Freq: Three times a day (TID) | ORAL | 0 refills | Status: DC | PRN

## 2023-06-24 MED ORDER — PREDNISONE 20 MG PO TABS: 40.0000 mg | ORAL_TABLET | Freq: Every day | ORAL | 0 refills | Status: DC
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
## 2023-07-11 ENCOUNTER — Ambulatory Visit: Payer: Self-pay | Admitting: Family Medicine

## 2023-07-22 ENCOUNTER — Ambulatory Visit (INDEPENDENT_AMBULATORY_CARE_PROVIDER_SITE_OTHER): Payer: PRIVATE HEALTH INSURANCE | Admitting: Family Medicine

## 2023-07-22 ENCOUNTER — Encounter: Payer: Self-pay | Admitting: Family Medicine

## 2023-07-22 VITALS — BP 112/74 | HR 92 | Temp 97.2°F | Ht 67.0 in | Wt 146.8 lb

## 2023-07-22 DIAGNOSIS — M545 Low back pain, unspecified: Secondary | ICD-10-CM | POA: Insufficient documentation

## 2023-07-22 DIAGNOSIS — M5412 Radiculopathy, cervical region: Secondary | ICD-10-CM | POA: Diagnosis not present

## 2023-07-22 DIAGNOSIS — G8929 Other chronic pain: Secondary | ICD-10-CM | POA: Insufficient documentation

## 2023-07-22 DIAGNOSIS — S161XXA Strain of muscle, fascia and tendon at neck level, initial encounter: Secondary | ICD-10-CM | POA: Insufficient documentation

## 2023-07-22 MED ORDER — METHOCARBAMOL 500 MG PO TABS
500.0000 mg | ORAL_TABLET | Freq: Three times a day (TID) | ORAL | 1 refills | Status: DC | PRN
Start: 2023-07-22 — End: 2023-09-07
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
## 2023-07-27 ENCOUNTER — Ambulatory Visit (INDEPENDENT_AMBULATORY_CARE_PROVIDER_SITE_OTHER): Payer: PRIVATE HEALTH INSURANCE | Admitting: Family Medicine

## 2023-07-27 ENCOUNTER — Encounter (HOSPITAL_BASED_OUTPATIENT_CLINIC_OR_DEPARTMENT_OTHER): Payer: Self-pay | Admitting: Family Medicine

## 2023-07-27 VITALS — BP 101/72 | HR 62 | Ht 67.0 in | Wt 150.0 lb

## 2023-07-27 DIAGNOSIS — M542 Cervicalgia: Secondary | ICD-10-CM | POA: Diagnosis not present

## 2023-07-27 DIAGNOSIS — M5412 Radiculopathy, cervical region: Secondary | ICD-10-CM | POA: Diagnosis not present
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
## 2023-07-27 NOTE — Patient Instructions (Signed)
  Medication Instructions:  Your physician recommends that you continue on your current medications as directed. Please refer to the Current Medication list given to you today. --If you need a refill on any your medications before your next appointment, please call your pharmacy first. If no refills are authorized on file call the office.-- Lab Work: Your physician has recommended that you have lab work today: No If you have labs (blood work) drawn today and your tests are completely normal, you will receive your results via MyChart message OR a phone call from our staff.  Please ensure you check your voicemail in the event that you authorized detailed messages to be left on a delegated number. If you have any lab test that is abnormal or we need to change your treatment, we will call you to review the results.  Referrals/Procedures/Imaging: MRI  Follow-Up: Your next appointment:   Your physician recommends that you schedule a follow-up appointment as needed with Dr. de Peru.  You will receive a text message or e-mail with a link to a survey about your care and experience with [place] today! We would greatly appreciate your feedback!   Thanks for letting us be apart of your health journey!!  Primary Care and Sports Medicine   Dr. Ceasar Mons Peru   We encourage you to activate your patient portal called "MyChart".  Sign up information is provided on this After Visit Summary.  MyChart is used to connect with patients for Virtual Visits (Telemedicine).  Patients are able to view lab/test results, encounter notes, upcoming appointments, etc.  Non-urgent messages can be sent to your provider as well. To learn more about what you can do with MyChart, please visit --  [email or web address].
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
## 2023-08-08 ENCOUNTER — Other Ambulatory Visit: Payer: Self-pay | Admitting: Podiatry

## 2023-08-08 ENCOUNTER — Other Ambulatory Visit: Payer: Self-pay | Admitting: Internal Medicine

## 2023-08-11 ENCOUNTER — Other Ambulatory Visit: Payer: Self-pay | Admitting: Internal Medicine

## 2023-08-16 ENCOUNTER — Telehealth: Payer: Self-pay | Admitting: Family Medicine

## 2023-08-16 ENCOUNTER — Other Ambulatory Visit: Payer: Self-pay

## 2023-08-16 MED ORDER — EPINEPHRINE 0.3 MG/0.3ML IJ SOAJ: 0.3000 mg | INTRAMUSCULAR | 1 refills | Status: DC | PRN

## 2023-08-16 NOTE — Telephone Encounter (Signed)
Prescription Request  08/16/2023  LOV: 07/22/2023  What is the name of the medication or equipment? EPINEPHrine 0.3 mg/0.3 mL IJ SOAJ injection [[id number]]   Have you contacted your pharmacy to request a refill? No   Which pharmacy would you like this sent to?  [id number] [place], [place] - [address] [place] [address] Phone: [phone] Fax: [phone]    Patient notified that their request is being sent to the clinical staff for review and that they should receive a response within 2 business days.   Please advise at Mobile [phone] (mobile)

## 2023-08-19 ENCOUNTER — Ambulatory Visit (HOSPITAL_BASED_OUTPATIENT_CLINIC_OR_DEPARTMENT_OTHER)
Admission: RE | Admit: 2023-08-19 | Discharge: 2023-08-19 | Disposition: A | Discharge status: Home / Self Care | Payer: 59 | Source: Ambulatory Visit | Attending: Family Medicine | Admitting: Family Medicine

## 2023-08-19 DIAGNOSIS — M5412 Radiculopathy, cervical region: Secondary | ICD-10-CM | POA: Insufficient documentation

## 2023-08-19 DIAGNOSIS — M542 Cervicalgia: Secondary | ICD-10-CM | POA: Diagnosis present

## 2023-08-25 ENCOUNTER — Other Ambulatory Visit (HOSPITAL_BASED_OUTPATIENT_CLINIC_OR_DEPARTMENT_OTHER): Payer: Self-pay

## 2023-08-29 ENCOUNTER — Ambulatory Visit (HOSPITAL_BASED_OUTPATIENT_CLINIC_OR_DEPARTMENT_OTHER): Payer: Self-pay | Admitting: Family Medicine

## 2023-09-01 ENCOUNTER — Ambulatory Visit (HOSPITAL_BASED_OUTPATIENT_CLINIC_OR_DEPARTMENT_OTHER): Payer: Self-pay | Admitting: Family Medicine

## 2023-09-06 ENCOUNTER — Ambulatory Visit (HOSPITAL_BASED_OUTPATIENT_CLINIC_OR_DEPARTMENT_OTHER): Payer: Self-pay | Admitting: Family Medicine

## 2023-09-07 ENCOUNTER — Encounter (HOSPITAL_BASED_OUTPATIENT_CLINIC_OR_DEPARTMENT_OTHER): Payer: Self-pay | Admitting: Family Medicine

## 2023-09-07 ENCOUNTER — Ambulatory Visit (INDEPENDENT_AMBULATORY_CARE_PROVIDER_SITE_OTHER): Payer: Self-pay | Admitting: Family Medicine

## 2023-09-07 VITALS — BP 105/63 | HR 67 | Ht 67.0 in | Wt 149.0 lb

## 2023-09-07 DIAGNOSIS — M5412 Radiculopathy, cervical region: Secondary | ICD-10-CM | POA: Diagnosis not present

## 2023-09-07 DIAGNOSIS — S161XXS Strain of muscle, fascia and tendon at neck level, sequela: Secondary | ICD-10-CM

## 2023-09-07 DIAGNOSIS — S161XXA Strain of muscle, fascia and tendon at neck level, initial encounter: Secondary | ICD-10-CM

## 2023-09-07 MED ORDER — PREDNISONE 20 MG PO TABS
40.0000 mg | ORAL_TABLET | Freq: Every day | ORAL | 0 refills | Status: DC
Start: 2023-09-07 — End: 2023-10-31

## 2023-09-07 MED ORDER — METHOCARBAMOL 500 MG PO TABS
500.0000 mg | ORAL_TABLET | Freq: Three times a day (TID) | ORAL | 0 refills | Status: DC | PRN
Start: 2023-09-07 — End: 2024-03-14
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
## 2023-09-12 ENCOUNTER — Ambulatory Visit: Payer: 59 | Attending: Family Medicine | Admitting: Physical Therapy

## 2023-09-12 ENCOUNTER — Encounter: Payer: Self-pay | Admitting: Physical Therapy

## 2023-09-12 ENCOUNTER — Other Ambulatory Visit: Payer: Self-pay

## 2023-09-12 DIAGNOSIS — M5412 Radiculopathy, cervical region: Secondary | ICD-10-CM | POA: Diagnosis not present

## 2023-09-12 DIAGNOSIS — M542 Cervicalgia: Secondary | ICD-10-CM | POA: Diagnosis not present

## 2023-09-12 DIAGNOSIS — M62838 Other muscle spasm: Secondary | ICD-10-CM | POA: Diagnosis not present

## 2023-09-12 DIAGNOSIS — M6281 Muscle weakness (generalized): Secondary | ICD-10-CM | POA: Diagnosis present

## 2023-09-12 NOTE — Patient Instructions (Signed)

## 2023-09-14 ENCOUNTER — Ambulatory Visit: Payer: 59 | Admitting: Physical Therapy

## 2023-09-14 ENCOUNTER — Encounter: Payer: Self-pay | Admitting: Physical Therapy

## 2023-09-14 DIAGNOSIS — M542 Cervicalgia: Secondary | ICD-10-CM

## 2023-09-14 DIAGNOSIS — M62838 Other muscle spasm: Secondary | ICD-10-CM

## 2023-09-14 DIAGNOSIS — M6281 Muscle weakness (generalized): Secondary | ICD-10-CM
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
## 2023-09-19 ENCOUNTER — Encounter: Payer: Self-pay | Admitting: Physical Therapy

## 2023-09-19 ENCOUNTER — Ambulatory Visit: Payer: 59 | Admitting: Physical Therapy

## 2023-09-19 DIAGNOSIS — M542 Cervicalgia: Secondary | ICD-10-CM | POA: Diagnosis not present

## 2023-09-19 DIAGNOSIS — M62838 Other muscle spasm: Secondary | ICD-10-CM

## 2023-09-19 DIAGNOSIS — M6281 Muscle weakness (generalized): Secondary | ICD-10-CM
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
## 2023-09-21 ENCOUNTER — Encounter: Payer: Self-pay | Admitting: Physical Therapy

## 2023-09-21 ENCOUNTER — Ambulatory Visit: Payer: 59 | Attending: Family Medicine | Admitting: Physical Therapy

## 2023-09-21 DIAGNOSIS — M62838 Other muscle spasm: Secondary | ICD-10-CM | POA: Insufficient documentation

## 2023-09-21 DIAGNOSIS — M542 Cervicalgia: Secondary | ICD-10-CM | POA: Diagnosis present

## 2023-09-21 DIAGNOSIS — M6281 Muscle weakness (generalized): Secondary | ICD-10-CM | POA: Insufficient documentation
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
## 2023-09-26 ENCOUNTER — Encounter: Payer: Self-pay | Admitting: Physical Therapy

## 2023-09-26 ENCOUNTER — Ambulatory Visit: Payer: 59 | Admitting: Physical Therapy

## 2023-09-26 DIAGNOSIS — M542 Cervicalgia: Secondary | ICD-10-CM

## 2023-09-26 DIAGNOSIS — M62838 Other muscle spasm: Secondary | ICD-10-CM

## 2023-09-26 DIAGNOSIS — M6281 Muscle weakness (generalized): Secondary | ICD-10-CM
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
## 2023-09-28 ENCOUNTER — Ambulatory Visit: Payer: 59 | Admitting: Physical Therapy

## 2023-09-28 ENCOUNTER — Encounter: Payer: Self-pay | Admitting: Physical Therapy

## 2023-09-28 DIAGNOSIS — M6281 Muscle weakness (generalized): Secondary | ICD-10-CM

## 2023-09-28 DIAGNOSIS — M62838 Other muscle spasm: Secondary | ICD-10-CM

## 2023-09-28 DIAGNOSIS — M542 Cervicalgia: Secondary | ICD-10-CM
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
## 2023-10-03 ENCOUNTER — Encounter: Payer: Self-pay | Admitting: Physical Therapy

## 2023-10-03 ENCOUNTER — Ambulatory Visit: Payer: 59 | Admitting: Physical Therapy

## 2023-10-03 DIAGNOSIS — M542 Cervicalgia: Secondary | ICD-10-CM

## 2023-10-03 DIAGNOSIS — M62838 Other muscle spasm: Secondary | ICD-10-CM

## 2023-10-03 DIAGNOSIS — M6281 Muscle weakness (generalized): Secondary | ICD-10-CM
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
## 2023-10-05 ENCOUNTER — Ambulatory Visit: Payer: 59 | Admitting: Physical Therapy

## 2023-10-05 ENCOUNTER — Encounter: Payer: Self-pay | Admitting: Physical Therapy

## 2023-10-05 DIAGNOSIS — M542 Cervicalgia: Secondary | ICD-10-CM

## 2023-10-05 DIAGNOSIS — M62838 Other muscle spasm: Secondary | ICD-10-CM

## 2023-10-05 DIAGNOSIS — M6281 Muscle weakness (generalized): Secondary | ICD-10-CM
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
## 2023-10-10 ENCOUNTER — Ambulatory Visit: Payer: 59 | Admitting: Physical Therapy

## 2023-10-10 ENCOUNTER — Encounter: Payer: Self-pay | Admitting: Physical Therapy

## 2023-10-10 DIAGNOSIS — M542 Cervicalgia: Secondary | ICD-10-CM

## 2023-10-10 DIAGNOSIS — M6281 Muscle weakness (generalized): Secondary | ICD-10-CM

## 2023-10-10 DIAGNOSIS — M62838 Other muscle spasm: Secondary | ICD-10-CM
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
## 2023-10-12 ENCOUNTER — Encounter: Payer: Self-pay | Admitting: Physical Therapy

## 2023-10-12 ENCOUNTER — Ambulatory Visit: Payer: 59 | Admitting: Physical Therapy

## 2023-10-12 DIAGNOSIS — M542 Cervicalgia: Secondary | ICD-10-CM

## 2023-10-12 DIAGNOSIS — M62838 Other muscle spasm: Secondary | ICD-10-CM

## 2023-10-12 DIAGNOSIS — M6281 Muscle weakness (generalized): Secondary | ICD-10-CM
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
## 2023-10-17 ENCOUNTER — Encounter: Payer: Self-pay | Admitting: Physical Therapy

## 2023-10-17 ENCOUNTER — Ambulatory Visit: Payer: 59 | Admitting: Physical Therapy

## 2023-10-17 DIAGNOSIS — M542 Cervicalgia: Secondary | ICD-10-CM

## 2023-10-17 DIAGNOSIS — M6281 Muscle weakness (generalized): Secondary | ICD-10-CM

## 2023-10-17 DIAGNOSIS — M62838 Other muscle spasm: Secondary | ICD-10-CM
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
## 2023-10-19 ENCOUNTER — Ambulatory Visit: Payer: 59 | Admitting: Physical Therapy

## 2023-10-19 ENCOUNTER — Ambulatory Visit (INDEPENDENT_AMBULATORY_CARE_PROVIDER_SITE_OTHER): Payer: 59 | Admitting: Family Medicine

## 2023-10-19 ENCOUNTER — Encounter (HOSPITAL_BASED_OUTPATIENT_CLINIC_OR_DEPARTMENT_OTHER): Payer: 59 | Admitting: Family Medicine

## 2023-10-19 ENCOUNTER — Encounter (HOSPITAL_BASED_OUTPATIENT_CLINIC_OR_DEPARTMENT_OTHER): Payer: Self-pay | Admitting: Family Medicine

## 2023-10-19 ENCOUNTER — Encounter: Payer: Self-pay | Admitting: Physical Therapy

## 2023-10-19 VITALS — BP 106/72 | HR 43 | Ht 66.0 in | Wt 147.0 lb

## 2023-10-19 DIAGNOSIS — M62838 Other muscle spasm: Secondary | ICD-10-CM

## 2023-10-19 DIAGNOSIS — M542 Cervicalgia: Secondary | ICD-10-CM

## 2023-10-19 DIAGNOSIS — M5412 Radiculopathy, cervical region: Secondary | ICD-10-CM | POA: Diagnosis not present

## 2023-10-19 DIAGNOSIS — M6281 Muscle weakness (generalized): Secondary | ICD-10-CM
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
## 2023-10-19 NOTE — Progress Notes (Signed)
    Procedures performed today:    None.  Independent interpretation of notes and tests performed by another provider:   None.  Brief History, Exam, Impression, and Recommendations:    BP 106/72 (BP Location: Right Arm, Patient Position: Sitting, Cuff Size: Normal)   Pulse (!) 43   Ht 5\' 6"  (1.676 m)   Wt 147 lb (66.7 kg)   SpO2 100%   BMI 23.73 kg/m   Neck pain Assessment & Plan: Patient reports that he continues to have neck pain, primarily along left side.  He will also have radiation of pain in down left arm at times.  He has been working with physical therapy with mixed results.  Generally, he does feel that there has been gradually worsening of some symptoms.  He did have MRI completed which showed evidence of mild spinal stenosis at C5-6 and C6-7, as well as evidence of mild to moderate foraminal narrowing at varying levels of the cervical spine, more so at C5-6 and C6-7. On exam, patient does have small palpable area near base of left side of neck which he indicates has been present for some time now.  Subcutaneous nodule is mobile, tender to palpation. Given continued symptoms and lack of notable improvement with conservative measures and finding observed on MRI, feel that further evaluation with spine specialist would be appropriate.  Patient amenable, referral placed today.  Area palpated within the soft tissues near base of left side of neck feels to be consistent with subcutaneous cyst however patient does have notable tenderness to palpation which is somewhat unusual.  Recommend discussing this further with spine specialist  Orders: -     Ambulatory referral to Orthopedic Surgery  C6 radiculopathy -     Ambulatory referral to Orthopedic Surgery  Return if symptoms worsen or fail to improve.   ___________________________________________ Velicia Dejager de [place], MD, ABFM, CAQSM Primary Care and Sports Medicine [facility]

## 2023-10-19 NOTE — Assessment & Plan Note (Signed)
Patient reports that he continues to have neck pain, primarily along left side.  He will also have radiation of pain in down left arm at times.  He has been working with physical therapy with mixed results.  Generally, he does feel that there has been gradually worsening of some symptoms.  He did have MRI completed which showed evidence of mild spinal stenosis at C5-6 and C6-7, as well as evidence of mild to moderate foraminal narrowing at varying levels of the cervical spine, more so at C5-6 and C6-7. On exam, patient does have small palpable area near base of left side of neck which he indicates has been present for some time now.  Subcutaneous nodule is mobile, tender to palpation. Given continued symptoms and lack of notable improvement with conservative measures and finding observed on MRI, feel that further evaluation with spine specialist would be appropriate.  Patient amenable, referral placed today.  Area palpated within the soft tissues near base of left side of neck feels to be consistent with subcutaneous cyst however patient does have notable tenderness to palpation which is somewhat unusual.  Recommend discussing this further with spine specialist

## 2023-10-24 ENCOUNTER — Encounter: Payer: Self-pay | Admitting: Physical Therapy

## 2023-10-24 ENCOUNTER — Ambulatory Visit: Payer: No Typology Code available for payment source | Attending: Family Medicine | Admitting: Physical Therapy

## 2023-10-24 DIAGNOSIS — M6281 Muscle weakness (generalized): Secondary | ICD-10-CM | POA: Diagnosis present

## 2023-10-24 DIAGNOSIS — M62838 Other muscle spasm: Secondary | ICD-10-CM | POA: Diagnosis present

## 2023-10-24 DIAGNOSIS — M542 Cervicalgia: Secondary | ICD-10-CM | POA: Diagnosis present
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
## 2023-10-26 ENCOUNTER — Ambulatory Visit: Payer: No Typology Code available for payment source | Admitting: Physical Therapy

## 2023-10-26 ENCOUNTER — Encounter: Payer: Self-pay | Admitting: Physical Therapy

## 2023-10-26 DIAGNOSIS — M62838 Other muscle spasm: Secondary | ICD-10-CM

## 2023-10-26 DIAGNOSIS — M542 Cervicalgia: Secondary | ICD-10-CM

## 2023-10-26 DIAGNOSIS — M6281 Muscle weakness (generalized): Secondary | ICD-10-CM
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
## 2023-10-31 ENCOUNTER — Other Ambulatory Visit (HOSPITAL_BASED_OUTPATIENT_CLINIC_OR_DEPARTMENT_OTHER): Payer: Self-pay | Admitting: Family Medicine

## 2023-10-31 ENCOUNTER — Ambulatory Visit: Payer: No Typology Code available for payment source | Admitting: Physical Therapy

## 2023-10-31 ENCOUNTER — Encounter: Payer: Self-pay | Admitting: Physical Therapy

## 2023-10-31 DIAGNOSIS — M542 Cervicalgia: Secondary | ICD-10-CM | POA: Diagnosis not present

## 2023-10-31 DIAGNOSIS — M6281 Muscle weakness (generalized): Secondary | ICD-10-CM

## 2023-10-31 DIAGNOSIS — M5412 Radiculopathy, cervical region: Secondary | ICD-10-CM

## 2023-10-31 DIAGNOSIS — M62838 Other muscle spasm: Secondary | ICD-10-CM
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
## 2023-11-01 ENCOUNTER — Encounter: Payer: Self-pay | Admitting: Orthopaedic Surgery

## 2023-11-01 ENCOUNTER — Ambulatory Visit (INDEPENDENT_AMBULATORY_CARE_PROVIDER_SITE_OTHER): Payer: 59 | Admitting: Orthopaedic Surgery

## 2023-11-01 VITALS — BP 91/60 | HR 80 | Ht 66.0 in | Wt 150.0 lb

## 2023-11-01 DIAGNOSIS — M47812 Spondylosis without myelopathy or radiculopathy, cervical region: Secondary | ICD-10-CM

## 2023-11-01 NOTE — Progress Notes (Unsigned)
    Office Visit Note   Patient: Brian Esparza           Date of Birth: [birth year]           MRN: [id number] Visit Date: 11/01/2023              Requested by: de [place], Raymond J, MD [address] [place],  [place] [address] PCP: Alyson Reedy, FNP   Assessment & Plan: Visit Diagnoses: No diagnosis found.  Plan: ***  Follow-Up Instructions: No follow-ups on file.   Orders:  No orders of the defined types were placed in this encounter.  No orders of the defined types were placed in this encounter.     Procedures: No procedures performed   Clinical Data: No additional findings.   Subjective: Chief Complaint  Patient presents with   Neck - Pain    HPI  Review of Systems   Objective: Vital Signs: BP 91/60   Pulse 80   Ht 5\' 6"  (1.676 m)   Wt 150 lb (68 kg)   BMI 24.21 kg/m   Physical Exam  Ortho Exam  Specialty Comments:  No specialty comments available.  Imaging: No results found.   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   Excessive cerumen in right ear canal 09/04/2020   Tobacco use 08/27/2020   Chest pain 08/27/2020   Encounter for hepatitis C screening test for low risk patient 12/28/2016   Nonintractable episodic headache 12/28/2016   Neck pain 12/28/2016   Past Medical History:  Diagnosis Date   Allergy    Asthma    Colitis     Family History  Problem Relation Age of Onset   Hypertension Maternal Grandfather    Stroke Maternal Grandfather    Hypertension Maternal Uncle    Stroke Maternal Uncle    Colon cancer Neg Hx     Past Surgical History:  Procedure Laterality Date   ANKLE SURGERY Left    Social History   Occupational History   Not on file  Tobacco Use   Smoking status: Some Days    Current packs/day: 0.50    Average  packs/day: 0.5 packs/day for 20.0 years (10.0 ttl pk-yrs)    Types: Cigarettes    Passive exposure: Current   Smokeless tobacco: Never  Vaping Use   Vaping status: Never Used  Substance and Sexual Activity   Alcohol use: Yes    Comment: occasionally   Drug use: Yes    Types: Marijuana    Comment: for headaches/pain from job injury   Sexual activity: Yes

## 2023-11-02 ENCOUNTER — Encounter: Payer: Self-pay | Admitting: Physical Therapy

## 2023-11-02 ENCOUNTER — Ambulatory Visit: Payer: No Typology Code available for payment source | Admitting: Physical Therapy

## 2023-11-02 DIAGNOSIS — M542 Cervicalgia: Secondary | ICD-10-CM

## 2023-11-02 DIAGNOSIS — M62838 Other muscle spasm: Secondary | ICD-10-CM

## 2023-11-02 DIAGNOSIS — M6281 Muscle weakness (generalized): Secondary | ICD-10-CM
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
## 2023-11-03 DIAGNOSIS — M47812 Spondylosis without myelopathy or radiculopathy, cervical region: Secondary | ICD-10-CM | POA: Insufficient documentation

## 2023-11-07 ENCOUNTER — Ambulatory Visit: Payer: No Typology Code available for payment source | Admitting: Physical Therapy

## 2023-11-07 ENCOUNTER — Encounter: Payer: Self-pay | Admitting: Physical Therapy

## 2023-11-07 DIAGNOSIS — M62838 Other muscle spasm: Secondary | ICD-10-CM

## 2023-11-07 DIAGNOSIS — M6281 Muscle weakness (generalized): Secondary | ICD-10-CM

## 2023-11-07 DIAGNOSIS — M542 Cervicalgia: Secondary | ICD-10-CM
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
## 2023-11-09 ENCOUNTER — Encounter: Payer: Self-pay | Admitting: Physical Therapy

## 2023-11-09 ENCOUNTER — Ambulatory Visit: Payer: No Typology Code available for payment source | Admitting: Physical Therapy

## 2023-11-09 DIAGNOSIS — M542 Cervicalgia: Secondary | ICD-10-CM | POA: Diagnosis not present

## 2023-11-09 DIAGNOSIS — M62838 Other muscle spasm: Secondary | ICD-10-CM

## 2023-11-09 DIAGNOSIS — M6281 Muscle weakness (generalized): Secondary | ICD-10-CM
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
## 2023-11-10 ENCOUNTER — Encounter (HOSPITAL_BASED_OUTPATIENT_CLINIC_OR_DEPARTMENT_OTHER): Payer: Self-pay | Admitting: Family Medicine

## 2023-11-16 ENCOUNTER — Ambulatory Visit: Payer: No Typology Code available for payment source | Admitting: Physical Therapy

## 2023-11-16 ENCOUNTER — Encounter: Payer: Self-pay | Admitting: Physical Therapy

## 2023-11-16 DIAGNOSIS — M62838 Other muscle spasm: Secondary | ICD-10-CM

## 2023-11-16 DIAGNOSIS — M6281 Muscle weakness (generalized): Secondary | ICD-10-CM

## 2023-11-16 DIAGNOSIS — M542 Cervicalgia: Secondary | ICD-10-CM | POA: Diagnosis not present
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
## 2023-11-23 ENCOUNTER — Encounter: Payer: Self-pay | Admitting: Physical Therapy

## 2023-11-23 ENCOUNTER — Ambulatory Visit: Payer: 59 | Attending: Family Medicine | Admitting: Physical Therapy

## 2023-11-23 DIAGNOSIS — M62838 Other muscle spasm: Secondary | ICD-10-CM | POA: Diagnosis present

## 2023-11-23 DIAGNOSIS — M6281 Muscle weakness (generalized): Secondary | ICD-10-CM | POA: Insufficient documentation

## 2023-11-23 DIAGNOSIS — M542 Cervicalgia: Secondary | ICD-10-CM | POA: Insufficient documentation
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
## 2023-11-28 ENCOUNTER — Encounter: Payer: Self-pay | Admitting: Physical Therapy

## 2023-11-28 ENCOUNTER — Ambulatory Visit: Payer: 59 | Admitting: Physical Therapy

## 2023-11-28 DIAGNOSIS — M62838 Other muscle spasm: Secondary | ICD-10-CM

## 2023-11-28 DIAGNOSIS — M542 Cervicalgia: Secondary | ICD-10-CM

## 2023-11-28 DIAGNOSIS — M6281 Muscle weakness (generalized): Secondary | ICD-10-CM
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
## 2023-11-30 ENCOUNTER — Ambulatory Visit: Payer: 59 | Admitting: Physical Therapy

## 2023-11-30 ENCOUNTER — Encounter: Payer: Self-pay | Admitting: Physical Therapy

## 2023-11-30 DIAGNOSIS — M542 Cervicalgia: Secondary | ICD-10-CM

## 2023-11-30 DIAGNOSIS — M6281 Muscle weakness (generalized): Secondary | ICD-10-CM | POA: Diagnosis not present

## 2023-11-30 DIAGNOSIS — M62838 Other muscle spasm: Secondary | ICD-10-CM
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
## 2024-03-12 ENCOUNTER — Ambulatory Visit
Admission: EM | Admit: 2024-03-12 | Discharge: 2024-03-12 | Disposition: A | Discharge status: Home / Self Care | Attending: Family Medicine | Admitting: Family Medicine

## 2024-03-12 DIAGNOSIS — H6691 Otitis media, unspecified, right ear: Secondary | ICD-10-CM | POA: Diagnosis not present

## 2024-03-12 DIAGNOSIS — B9689 Other specified bacterial agents as the cause of diseases classified elsewhere: Secondary | ICD-10-CM

## 2024-03-12 MED ORDER — DOXYCYCLINE HYCLATE 100 MG PO CAPS: 100.0000 mg | ORAL_CAPSULE | Freq: Two times a day (BID) | ORAL | 0 refills | Status: DC

## 2024-03-12 NOTE — Discharge Instructions (Signed)
 Start doxycycline for the infection of the tragus of your right ear. Use warm compresses 3-5 times daily 5-10 minutes at a time.

## 2024-03-12 NOTE — ED Provider Notes (Signed)
 [facility] URG[facility]ARE [facility]  Note:  This document was prepared using Conservation officer, historic buildings and may include unintentional dictation errors.  MRN: [id number] DOB: 1981-03-20  Subjective:   Brian Esparza is a 43 y.o. male presenting for 1 day history of persistent right ear pain, swelling.  Believes he had a bug bite but cannot recall any particular incident.  No fever, drainage of pus or bleeding, redness.   No current facility-administered medications for this encounter.  Current Outpatient Medications:    methocarbamol (ROBAXIN) 500 MG tablet, Take 1 tablet (500 mg total) by mouth every 8 (eight) hours as needed for muscle spasms. (Patient not taking: Reported on 09/12/2023), Disp: 30 tablet, Rfl: 0   predniSONE (DELTASONE) 20 MG tablet, TAKE 2 TABLETS (40 MG TOTAL) BY MOUTH DAILY., Disp: 10 tablet, Rfl: 0   Allergies  Allergen Reactions   Asa [Aspirin] Rash    Past Medical History:  Diagnosis Date   Allergy    Asthma    Colitis      Past Surgical History:  Procedure Laterality Date   ANKLE SURGERY Left     Family History  Problem Relation Age of Onset   Hypertension Maternal Grandfather    Stroke Maternal Grandfather    Hypertension Maternal Uncle    Stroke Maternal Uncle    Colon cancer Neg Hx     Social History   Tobacco Use   Smoking status: Some Days    Current packs/day: 0.50    Average packs/day: 0.5 packs/day for 20.0 years (10.0 ttl pk-yrs)    Types: Cigarettes    Passive exposure: Current   Smokeless tobacco: Never  Vaping Use   Vaping status: Never Used  Substance Use Topics   Alcohol use: Yes    Comment: occasionally   Drug use: Yes    Types: Marijuana    Comment: for headaches/pain from job injury    ROS   Objective:   Vitals: BP 104/71 (BP Location: Left Arm)   Pulse 66   Temp 98 F (36.7 C) (Oral)   Resp 14   SpO2 99%   Physical Exam Constitutional:      General: He is not in acute distress.     Appearance: Normal appearance. He is well-developed and normal weight. He is not ill-appearing, toxic-appearing or diaphoretic.  HENT:     Head: Normocephalic and atraumatic.     Right Ear: External ear normal.     Left Ear: External ear normal.     Ears:      Nose: Nose normal.     Mouth/Throat:     Pharynx: Oropharynx is clear.  Eyes:     General: No scleral icterus.       Right eye: No discharge.        Left eye: No discharge.     Extraocular Movements: Extraocular movements intact.  Cardiovascular:     Rate and Rhythm: Normal rate.  Pulmonary:     Effort: Pulmonary effort is normal.  Musculoskeletal:     Cervical back: Normal range of motion.  Neurological:     Mental Status: He is alert and oriented to person, place, and time.  Psychiatric:        Mood and Affect: Mood normal.        Behavior: Behavior normal.        Thought Content: Thought content normal.        Judgment: Judgment normal.     Assessment and Plan :  PDMP not reviewed this encounter.  1. Bacterial ear infection, right    Will have patient start doxycycline for superficial infection of the right tragus.  Area not amenable to incision and drainage.  Use warm compresses.  Counseled patient on potential for adverse effects with medications prescribed/recommended today, ER and return-to-clinic precautions discussed, patient verbalized understanding.    Wallis Bamberg, [place] 03/12/24 1332

## 2024-03-12 NOTE — ED Triage Notes (Signed)
 Pt reports swelling in the right ear since yesterday. Pt think he has a bug bite in the right ear.

## 2024-03-14 ENCOUNTER — Ambulatory Visit: Admitting: Family Medicine

## 2024-03-14 ENCOUNTER — Ambulatory Visit (INDEPENDENT_AMBULATORY_CARE_PROVIDER_SITE_OTHER): Admitting: Student in an Organized Health Care Education/Training Program

## 2024-03-14 VITALS — BP 100/68 | HR 66 | Temp 98.1°F | Ht 66.14 in | Wt 159.0 lb

## 2024-03-14 DIAGNOSIS — S161XXD Strain of muscle, fascia and tendon at neck level, subsequent encounter: Secondary | ICD-10-CM

## 2024-03-14 DIAGNOSIS — M47812 Spondylosis without myelopathy or radiculopathy, cervical region: Secondary | ICD-10-CM

## 2024-03-14 DIAGNOSIS — M5412 Radiculopathy, cervical region: Secondary | ICD-10-CM | POA: Diagnosis not present
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
## 2024-03-20 ENCOUNTER — Other Ambulatory Visit: Payer: Self-pay

## 2024-03-20 ENCOUNTER — Ambulatory Visit: Attending: Student in an Organized Health Care Education/Training Program | Admitting: Physical Therapy

## 2024-03-20 DIAGNOSIS — G8929 Other chronic pain: Secondary | ICD-10-CM | POA: Diagnosis present

## 2024-03-20 DIAGNOSIS — M545 Low back pain, unspecified: Secondary | ICD-10-CM | POA: Diagnosis present

## 2024-03-20 DIAGNOSIS — M6281 Muscle weakness (generalized): Secondary | ICD-10-CM | POA: Diagnosis present

## 2024-03-20 DIAGNOSIS — M47812 Spondylosis without myelopathy or radiculopathy, cervical region: Secondary | ICD-10-CM | POA: Insufficient documentation

## 2024-03-20 DIAGNOSIS — M542 Cervicalgia: Secondary | ICD-10-CM | POA: Diagnosis present

## 2024-03-20 DIAGNOSIS — R293 Abnormal posture: Secondary | ICD-10-CM | POA: Insufficient documentation
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
## 2024-03-27 ENCOUNTER — Encounter: Payer: Self-pay | Admitting: Physical Therapy

## 2024-03-27 ENCOUNTER — Ambulatory Visit: Admitting: Physical Therapy

## 2024-03-27 DIAGNOSIS — M545 Low back pain, unspecified: Secondary | ICD-10-CM

## 2024-03-27 DIAGNOSIS — R293 Abnormal posture: Secondary | ICD-10-CM

## 2024-03-27 DIAGNOSIS — M6281 Muscle weakness (generalized): Secondary | ICD-10-CM

## 2024-03-27 DIAGNOSIS — M542 Cervicalgia: Secondary | ICD-10-CM
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
## 2024-04-04 ENCOUNTER — Ambulatory Visit: Admitting: Physical Therapy

## 2024-04-09 ENCOUNTER — Ambulatory Visit: Admitting: Physical Therapy

## 2024-04-09 ENCOUNTER — Encounter: Payer: Self-pay | Admitting: Physical Therapy

## 2024-04-09 DIAGNOSIS — M6281 Muscle weakness (generalized): Secondary | ICD-10-CM

## 2024-04-09 DIAGNOSIS — M542 Cervicalgia: Secondary | ICD-10-CM | POA: Diagnosis not present
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
## 2024-04-11 ENCOUNTER — Ambulatory Visit: Admitting: Physical Therapy

## 2024-04-17 ENCOUNTER — Ambulatory Visit: Admitting: Physical Therapy

## 2024-04-17 ENCOUNTER — Telehealth: Payer: Self-pay | Admitting: Physical Therapy
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
## 2024-04-17 NOTE — Telephone Encounter (Signed)
 04-17-24  NO SHOW>  pt called and reminded he had missed appt. Pt stated he thought appt was on another day. He is also attending another PT for back and workmans comp evaluation he did last week.  Pt agreed to DC at [facility] and complete his current PT with workmans comp.  Pt was reminded of our attendance policy and was told he would need a new referral if he needs further PT after his current workman's comp claim is complete. Pt verbalized understanding  Sharlet Dawson, PT, [facility] Certified Exercise Expert for the Aging Adult  04/17/24 2:43 PM Phone: [phone] Fax: [phone]

## 2024-04-25 ENCOUNTER — Ambulatory Visit (INDEPENDENT_AMBULATORY_CARE_PROVIDER_SITE_OTHER): Admitting: Student in an Organized Health Care Education/Training Program

## 2024-04-25 ENCOUNTER — Encounter: Payer: Self-pay | Admitting: Student in an Organized Health Care Education/Training Program

## 2024-04-25 VITALS — BP 110/72 | HR 74 | Wt 154.0 lb

## 2024-04-25 DIAGNOSIS — M47812 Spondylosis without myelopathy or radiculopathy, cervical region: Secondary | ICD-10-CM | POA: Diagnosis not present

## 2024-04-25 DIAGNOSIS — G8929 Other chronic pain: Secondary | ICD-10-CM | POA: Diagnosis not present

## 2024-04-25 DIAGNOSIS — M545 Low back pain, unspecified: Secondary | ICD-10-CM | POA: Diagnosis not present

## 2024-04-25 MED ORDER — DULOXETINE HCL 30 MG PO CPEP
30.0000 mg | ORAL_CAPSULE | Freq: Every day | ORAL | 1 refills | Status: AC
Start: 2024-04-25 — End: ?
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
## 2024-04-25 NOTE — Assessment & Plan Note (Signed)
 Chronic low back pain has been an intermittent issue for him for several years, most recent flare affecting him for about 2 months.  We talked about the natural course of chronic low back pain.  Talked about options of safe use of NSAIDs and daily medications.  We decided to try Cymbalta given his chronic pain experience in his neck, low back, and some possible depressed mood.  Follow-up with me in 3 months to check on his response to this medicine.  We talked about reasonable expectations for Cymbalta and potential side effects.
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
## 2024-07-26 ENCOUNTER — Ambulatory Visit: Admitting: Student in an Organized Health Care Education/Training Program

## 2024-08-02 ENCOUNTER — Encounter: Payer: Self-pay | Admitting: Student in an Organized Health Care Education/Training Program

## 2024-10-22 ENCOUNTER — Encounter: Payer: Self-pay | Admitting: Radiology
